# Patient Record
Sex: Female | Born: 1965 | Race: White | Hispanic: No | Marital: Single | Smoking: Never smoker
Health system: Southern US, Community
[De-identification: ages and names within clinical notes are randomized; demographics above are authoritative.]

## PROBLEM LIST (undated history)

## (undated) DIAGNOSIS — Z91018 Allergy to other foods: Secondary | ICD-10-CM

## (undated) DIAGNOSIS — E079 Disorder of thyroid, unspecified: Secondary | ICD-10-CM

## (undated) HISTORY — PX: ADENOIDECTOMY: SUR15

## (undated) HISTORY — PX: TONSILLECTOMY: SUR1361

---

## 2011-03-13 DIAGNOSIS — E039 Hypothyroidism, unspecified: Secondary | ICD-10-CM | POA: Insufficient documentation

## 2012-07-21 ENCOUNTER — Emergency Department
Admission: EM | Admit: 2012-07-21 | Discharge: 2012-07-21 | Disposition: A | Discharge status: Home / Self Care | Payer: BC Managed Care – PPO | Source: Home / Self Care | Attending: Family Medicine | Admitting: Family Medicine

## 2012-07-21 ENCOUNTER — Encounter: Payer: Self-pay | Admitting: *Deleted

## 2012-07-21 DIAGNOSIS — J069 Acute upper respiratory infection, unspecified: Secondary | ICD-10-CM

## 2012-07-21 DIAGNOSIS — M940 Chondrocostal junction syndrome [Tietze]: Secondary | ICD-10-CM

## 2012-07-21 HISTORY — DX: Disorder of thyroid, unspecified: E07.9

## 2012-07-21 MED ORDER — AMOXICILLIN-POT CLAVULANATE 875-125 MG PO TABS: 1.0000 | ORAL_TABLET | Freq: Two times a day (BID) | ORAL | Status: DC

## 2012-07-21 MED ORDER — BENZONATATE 200 MG PO CAPS: 200.0000 mg | ORAL_CAPSULE | Freq: Every day | ORAL | Status: DC

## 2012-07-21 NOTE — ED Notes (Signed)
Pt c/o cough, fatigue, body aches, and nasal congestion x 9 days. She reports having diarrhea the first day which resolved after 1 day. She has taken benadryl, Robtussin And ASA. She also has taken Augmentin x 7 days for her recurrent tonsillar issues.

## 2012-07-21 NOTE — ED Provider Notes (Signed)
History     CSN: [id number]  Arrival date & time 07/21/12  1024   First MD Initiated Contact with Patient 07/21/12 1048      Chief Complaint  Patient presents with  . Cough  . Fatigue       HPI Comments: Patient complains of approximately 8 day history of gradually progressive URI symptoms beginning with a mild sore throat (now improved), followed by nasal congestion and a cough.  Complains of fatigue and initial myalgias.  Cough is now worse at night and generally non-productive during the day.  There has been no pleuritic pain, shortness of breath, or wheezes.  However, she has now developed tightness over her anterior chest and sternum.  She has a history of adenoid infections, and her PCP started her on Augmentin, which she finishes today.  The history is provided by the patient.    Past Medical History  Diagnosis Date  . Thyroid disease     Past Surgical History  Procedure Laterality Date  . Tonsillectomy    . Adenoidectomy      Family History  Problem Relation Age of Onset  . Parkinson's disease Father     History  Substance Use Topics  . Smoking status: Never Smoker   . Smokeless tobacco: Not on file  . Alcohol Use: No    OB History   Grav Para Term Preterm Abortions TAB SAB Ect Mult Living                  Review of Systems + sore throat + cough No pleuritic pain, but has tightness in anterior chest No wheezing + nasal congestion + post-nasal drainage No sinus pain/pressure No itchy/red eyes No earache No hemoptysis No SOB No fever, + chills No nausea No vomiting No abdominal pain No diarrhea No urinary symptoms No skin rashes + fatigue + myalgias + headache Used OTC meds without relief  Allergies  Bactrim and Shellfish allergy  Home Medications   Current Outpatient Rx  Name  Route  Sig  Dispense  Refill  . levothyroxine (SYNTHROID, LEVOTHROID) 25 MCG tablet   Oral   Take 25 mcg by mouth daily.         Marland Kitchen  amoxicillin-clavulanate (AUGMENTIN) 875-125 MG per tablet   Oral   Take 1 tablet by mouth 2 (two) times daily. Take with food   10 tablet   0   . benzonatate (TESSALON) 200 MG capsule   Oral   Take 1 capsule (200 mg total) by mouth at bedtime.   12 capsule   0     BP 97/67  Pulse 80  Temp(Src) 98.5 F (36.9 C) (Oral)  Ht 5\' 7"  (1.702 m)  Wt 135 lb (61.236 kg)  BMI 21.14 kg/m2  SpO2 98%  Physical Exam Nursing notes and Vital Signs reviewed. Appearance:  Patient appears healthy, stated age, and in no acute distress Eyes:  Pupils are equal, round, and reactive to light and accomodation.  Extraocular movement is intact.  Conjunctivae are not inflamed  Ears:  Canals normal.  Tympanic membranes normal.  Nose:  Mildly congested turbinates.  No sinus tenderness.    Pharynx:  Normal Neck:  Supple.  Tender shotty posterior nodes are palpated bilaterally  Lungs:  Clear to auscultation.  Breath sounds are equal.  Chest:  Distinct tenderness to palpation over the mid-sternum.  Heart:  Regular rate and rhythm without murmurs, rubs, or gallops.  Abdomen:  Nontender without masses or hepatosplenomegaly.  Bowel sounds  are present.  No CVA or flank tenderness.  Extremities:  No edema.  No calf tenderness Skin:  No rash present.   ED Course  Procedures        1. Acute upper respiratory infections of unspecified site   2. Costochondritis       MDM  Prescription written for Benzonatate (Tessalon) to take at bedtime for night-time cough.  Take Mucinex D (guaifenesin with decongestant) twice daily for congestion.  Increase fluid intake, rest. May use Afrin nasal spray (or generic oxymetazoline) twice daily for about 5 days.  Also recommend using saline nasal spray several times daily and saline nasal irrigation (AYR is a common brand) Stop all antihistamines for now, and other non-prescription cough/cold preparations. May take Ibuprofen 200mg , 4 tabs every 8 hours with food for  chest/sternum discomfort. Continue Augmentin for 5 more days. Follow-up with family doctor if not improving 7 to 10 days.         Lattie Haw, MD 07/21/12 1137

## 2012-07-24 ENCOUNTER — Telehealth: Payer: Self-pay | Admitting: Family Medicine

## 2016-04-15 DIAGNOSIS — D500 Iron deficiency anemia secondary to blood loss (chronic): Secondary | ICD-10-CM | POA: Insufficient documentation

## 2016-04-18 DIAGNOSIS — E559 Vitamin D deficiency, unspecified: Secondary | ICD-10-CM | POA: Insufficient documentation

## 2017-04-06 DIAGNOSIS — N920 Excessive and frequent menstruation with regular cycle: Secondary | ICD-10-CM | POA: Insufficient documentation

## 2017-04-07 DIAGNOSIS — R790 Abnormal level of blood mineral: Secondary | ICD-10-CM | POA: Insufficient documentation

## 2018-04-30 DIAGNOSIS — C787 Secondary malignant neoplasm of liver and intrahepatic bile duct: Secondary | ICD-10-CM | POA: Insufficient documentation

## 2018-04-30 DIAGNOSIS — C189 Malignant neoplasm of colon, unspecified: Secondary | ICD-10-CM | POA: Insufficient documentation

## 2018-07-27 DIAGNOSIS — E876 Hypokalemia: Secondary | ICD-10-CM | POA: Insufficient documentation

## 2019-01-04 DIAGNOSIS — Z91018 Allergy to other foods: Secondary | ICD-10-CM | POA: Insufficient documentation

## 2019-09-07 DIAGNOSIS — G909 Disorder of the autonomic nervous system, unspecified: Secondary | ICD-10-CM | POA: Insufficient documentation

## 2019-09-07 DIAGNOSIS — Z85038 Personal history of other malignant neoplasm of large intestine: Secondary | ICD-10-CM | POA: Insufficient documentation

## 2019-09-07 DIAGNOSIS — Z87892 Personal history of anaphylaxis: Secondary | ICD-10-CM | POA: Insufficient documentation

## 2020-03-06 ENCOUNTER — Ambulatory Visit (INDEPENDENT_AMBULATORY_CARE_PROVIDER_SITE_OTHER): Payer: BC Managed Care – PPO

## 2020-03-06 ENCOUNTER — Encounter: Payer: Self-pay | Admitting: Family Medicine

## 2020-03-06 ENCOUNTER — Other Ambulatory Visit: Payer: Self-pay

## 2020-03-06 ENCOUNTER — Ambulatory Visit (INDEPENDENT_AMBULATORY_CARE_PROVIDER_SITE_OTHER): Payer: BC Managed Care – PPO | Admitting: Family Medicine

## 2020-03-06 VITALS — BP 102/82 | HR 73 | Ht 67.0 in | Wt 137.0 lb

## 2020-03-06 DIAGNOSIS — M999 Biomechanical lesion, unspecified: Secondary | ICD-10-CM | POA: Diagnosis not present

## 2020-03-06 DIAGNOSIS — M545 Low back pain, unspecified: Secondary | ICD-10-CM | POA: Diagnosis not present

## 2020-03-06 DIAGNOSIS — J986 Disorders of diaphragm: Secondary | ICD-10-CM | POA: Insufficient documentation

## 2020-03-06 DIAGNOSIS — M542 Cervicalgia: Secondary | ICD-10-CM | POA: Diagnosis not present

## 2020-03-06 DIAGNOSIS — M546 Pain in thoracic spine: Secondary | ICD-10-CM

## 2020-03-06 DIAGNOSIS — G8929 Other chronic pain: Secondary | ICD-10-CM

## 2020-03-06 DIAGNOSIS — M549 Dorsalgia, unspecified: Secondary | ICD-10-CM | POA: Insufficient documentation

## 2020-03-06 MED ORDER — QVAR REDIHALER 40 MCG/ACT IN AERB: 1.0000 | INHALATION_SPRAY | Freq: Two times a day (BID) | RESPIRATORY_TRACT | 0 refills | Status: DC

## 2020-03-06 MED ORDER — ALBUTEROL SULFATE HFA 108 (90 BASE) MCG/ACT IN AERS: 2.0000 | INHALATION_SPRAY | RESPIRATORY_TRACT | 6 refills | Status: DC | PRN

## 2020-03-06 NOTE — Assessment & Plan Note (Signed)
   Decision today to treat with OMT was based on Physical Exam  After verbal consent patient was treated with HVLA, ME, FPR techniques in cervical, thoracic, rib,  areas,  Patient tolerated the procedure well with improvement in symptoms  Patient given exercises, stretches and lifestyle modifications  See medications in patient instructions if given  Patient will follow up in 4-8 weeks 

## 2020-03-06 NOTE — Patient Instructions (Signed)
Ventolin try when you have the spasm Quvar daily for 2 weeks if other physicians say its ok Scapular ex Xrays today Consider hemp protein See me again in 4-6 weeks

## 2020-03-06 NOTE — Assessment & Plan Note (Signed)
Patient has diffuse back pain. Lower aorta, thoracic as well as cervical.  Patient did respond fairly well to osteopathic manipulation today.  Patient does have a lot of anxiety with patient's health issues.  Has significant difficulty with multiple different medications and vitamins which makes it difficult to treat as well.  Patient describes something is more of a spasm and wondering if there is some mild diaphragmatic irritation secondary to the surgery as well as her not completely addressing pain with scapular dyskinesis and weakness.  Patient is also having difficulty with nutrition medicine could be contributing as well.Bianca [name]

## 2020-03-06 NOTE — Assessment & Plan Note (Addendum)
Questionable neck irritation.  Discussed case and Ventolin as potential causes and see if this would be helpful.  If no improvement patient is to discontinue.  Patient with the inhaled steroid wants to check with her other physicians before she starts it which I said would be fine as well.  Would only want her to use it for 2 weeks to see if it would be beneficial.  I am not completely optimistic this is all her problems and I do think that there is likely some psychosomatic aspect to it as well.  We will help where we can can.

## 2020-03-06 NOTE — Progress Notes (Signed)
[facility] [place] Phone: [phone] Subjective:   [name], am serving as a scribe for Dr. Hulan Saas. This visit occurred during the SARS-CoV-2 public health emergency.  Safety protocols were in place, including screening questions prior to the visit, additional usage of staff PPE, and extensive cleaning of exam room while observing appropriate contact time as indicated for disinfecting solutions.   I'm seeing this patient by the request  of:  Teodoro Kil, PA-C  CC: Low back pain  [id number]  Letasha Kershaw is a 54 y.o. female coming in with complaint of neck and back pain. Patient had liver surgery last year for cancer. Patient unable to stand straight due to pain in lower back. Patient has been doing physical therapy.  Patient has had multiple scans and I cannot find what is kind of going on at this time.  Likely there has been no recurrence of the liver cancer status post lobectomy   Also having pain in ribs and cervical spine. Has tried muscle relaxors in hospital but did not take any outside of hospital due to a dairy allergy.   Patient feels like she has chest cramps where her muscles spasm. Patient has neuropathy and hypertension so unable to stand for long periods of time.  Patient states that the muscle cramps seem to be under her rib cage.  Happens in some so she can sometimes get on the brake when she tries to straighten up.  Has significant difficulty.  Sees acupuncturist in [place]. This treatment has been helping.  Patient has difficulty with food still with her gastrointestinal.  Patient states that he is unable to get the protein that she is supposed to get secondary to this.  He thinks that could be contributing as well.     Patient is seeing pmr has multiple visits already scheduled seems to be getting acupuncture.  Patient has also undergone a significant laboratory work-up recently  that was fairly unremarkable.  Recent MRI of the abdomen and pelvis was reviewed showed the post ablative changes from the surgical intervention but otherwise fairly unremarkable.  Past Medical History:  Diagnosis Date  . Thyroid disease    Past Surgical History:  Procedure Laterality Date  . ADENOIDECTOMY    . TONSILLECTOMY     Social History   Socioeconomic History  . Marital status: Single    Spouse name: Not on file  . Number of children: Not on file  . Years of education: Not on file  . Highest education level: Not on file  Occupational History  . Not on file  Tobacco Use  . Smoking status: Never Smoker  Substance and Sexual Activity  . Alcohol use: No  . Drug use: No  . Sexual activity: Not on file  Other Topics Concern  . Not on file  Social History Narrative  . Not on file   Social Determinants of Health   Financial Resource Strain:   . Difficulty of Paying Living Expenses: Not on file  Food Insecurity:   . Worried About Charity fundraiser in the Last Year: Not on file  . Ran Out of Food in the Last Year: Not on file  Transportation Needs:   . Lack of Transportation (Medical): Not on file  . Lack of Transportation (Non-Medical): Not on file  Physical Activity:   . Days of Exercise per Week: Not on file  . Minutes of Exercise per Session: Not on  file  Stress:   . Feeling of Stress : Not on file  Social Connections:   . Frequency of Communication with Friends and Family: Not on file  . Frequency of Social Gatherings with Friends and Family: Not on file  . Attends Religious Services: Not on file  . Active Member of Clubs or Organizations: Not on file  . Attends Archivist Meetings: Not on file  . Marital Status: Not on file   Allergies  Allergen Reactions  . Bactrim [Sulfamethoxazole-Trimethoprim]   . Shellfish Allergy    Family History  Problem Relation Age of Onset  . Parkinson's disease Father     Current Outpatient Medications  (Endocrine & Metabolic):  .  levothyroxine (SYNTHROID, LEVOTHROID) 25 MCG tablet, Take 25 mcg by mouth daily.   Current Outpatient Medications (Respiratory):  .  benzonatate (TESSALON) 200 MG capsule, Take 1 capsule (200 mg total) by mouth at bedtime. Marland Kitchen  albuterol (VENTOLIN HFA) 108 (90 Base) MCG/ACT inhaler, Inhale 2 puffs into the lungs every 4 (four) hours as needed for wheezing or shortness of breath. .  beclomethasone (QVAR REDIHALER) 40 MCG/ACT inhaler, Inhale 1 puff into the lungs 2 (two) times daily.    Current Outpatient Medications (Other):  .  amoxicillin-clavulanate (AUGMENTIN) 875-125 MG per tablet, Take 1 tablet by mouth 2 (two) times daily. Take with food   Reviewed prior external information including notes and imaging from  primary care provider As well as notes that were available from care everywhere and other healthcare systems.  Past medical history, social, surgical and family history all reviewed in electronic medical record.  No pertanent information unless stated regarding to the chief complaint.   Review of Systems:  No headache, visual changes, nausea, vomiting,  constipation, dizziness, skin rash, fevers, chills, night sweats, weight loss, swollen lymph nodes, body aches, joint swelling, chest pain, shortness of breath, mood changes. POSITIVE muscle aches, body aches, abdominal pain and diarrhea  Objective  Blood pressure 102/82, pulse 73, height 5\' 7"  (1.702 m), weight 137 lb (62.1 kg), SpO2 99 %.   General: No apparent distress alert and oriented x3 mood and affect normal, dressed appropriately.  HEENT: Pupils equal, extraocular movements intact  Respiratory: Patient's speak in full sentences and does not appear short of breath patient though does have difficulty taking deep breaths. Cardiovascular: No lower extremity edema, non tender, no erythema  Gait normal with good balance and coordination.  MSK:  Non tender with full range of motion and good  stability and symmetric strength and tone of shoulders, elbows, wrist, hip, knee and ankles bilaterally.  Upper back exam shows the patient does have some very mild kyphosis noted.  The patient does have some difficulty laying flat.  Patient's neck does have some mild tightness with side bending but negative Spurling's.  More of a tightness of the muscles.  Mild scapular dyskinesis noted right greater than left.  No spinous process tenderness noted. Low back exam also has some mild loss of lordosis.  Osteopathic findings   C4 flexed rotated and side bent left C6 flexed rotated and side bent left T3 extended rotated and side bent right inhaled third rib T7 extended rotated and side bent left T10 extended rotated and side bent right  [id number]; 15 additional minutes spent for Therapeutic exercises as stated in above notes.  This included exercises focusing on stretching, strengthening, with significant focus on eccentric aspects.   Long term goals include an improvement in range of motion, strength, endurance  as well as avoiding reinjury. Patient's frequency would include in 1-2 times a day, 3-5 times a week for a duration of 6-12 weeks. Exercises that included:  Basic scapular stabilization to include adduction and depression of scapula Scaption, focusing on proper movement and good control Internal and External rotation utilizing a theraband, with elbow tucked at side entire time Rows with theraband   Proper technique shown and discussed handout in great detail with ATC.  All questions were discussed and answered.     Impression and Recommendations:     The above documentation has been reviewed and is accurate and complete Lyndal Pulley, DO

## 2020-03-12 DIAGNOSIS — E538 Deficiency of other specified B group vitamins: Secondary | ICD-10-CM | POA: Insufficient documentation

## 2020-04-08 NOTE — Progress Notes (Signed)
[facility] [address] [place] Phone: [phone] Subjective:   I Bianca [name] am serving as a Education administrator for Dr. Hulan Saas.  This visit occurred during the SARS-CoV-2 public health emergency.  Safety protocols were in place, including screening questions prior to the visit, additional usage of staff PPE, and extensive cleaning of exam room while observing appropriate contact time as indicated for disinfecting solutions.   I'm seeing this patient by the request  of:  Teodoro Kil, PA-C  CC: Stomach and back pain follow-up  [id number]  Bianca [name] is a 54 y.o. female coming in with complaint of back, diaphragm and neck pain. OMT 03/06/2020. Patient states her pain is moderate. Went to PT yesterday. Knows that her pain will take time. After last adjustment she noticed improvements. States her pain feels like a solar plexus. States that her pain is sensitive to certain movements. Eating triggers the pain. Abdomen is a little swollen. Overall she believes she is making progress. Anything that causes pressure on the ribs/ abdomen causes pain. Calcium may be off.   Medications patient has been prescribed: Qvar, Ventolin feels like it made improvement initially but does not need it anymore.  Taken from oncology note  ONCOLOGIC HISTORY  1. 04/25/18 CT A/P Findings consistent with small bowel obstruction with dilated distal small bowel measuring up to 4.0 cm in diameter the point of obstruction appears to be at the ileocecal valve with there is suspicion of a soft tissue mass on image 81 of series There is a 2.3 x 2.7 cm lesion in the right lobe of the liver on image 29 of series 2 which does not have the appearance of a cyst or hemangioma although this could be a benign lesion metastatic disease cannot be excluded. 2 additional subcentimeter  lesions are noted in the right lobe of the liver. 2. 04/26/18 CEA 31.8, CA 19-9 156 3. 04/26/18  Ascites benign mesothelial cells and mixed inflammation. Negative for malignancy.  4. 04/26/18 Xray recommend advancement gastric tube 5. 04/26/18 CXR no acute cardiopulmonary disease. Recommend advance gastric tube 6. 04/26/18 R colon, ileum, appendix, R hemicolectomy. 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 4.2 cm in greatest dimension. Extends through muscularis propria into subserosal soft tissue but does not invade serosa. Suspicious for vascular invasion. Margins negative. 1/27 LNs. Appendix with residual tubular adenoma with HGD. [id number].  7. 04/26/18 Liver core needle biopsy metastatic adenocarcinoma morphologically c/w colon primary.  8. NRAS, KRAS mutations not detected. BRAF IHC stain positive. MSS by IHC (OSH testing). CDX-2 stain positive.  9. 04/30/18 CT chest small bilateral pleural effusions with bibasilar opacities likely atelectasis. No mets in chest. Liver lesion concerning for met. Indeterminate splenic lesion. 10. 06/29/18 Initiation of FOLFOX  11. [facility] with BRAF V600E (as well as other findings which we can insert here) 12. 07/13/18 Switch to FOLFIRINOX 13. 07/27/18 Removed irinotecan from treatment plan, now back on FOLFOX  14. 09/07/18 Continuing FOLFOX Cycle 6  15. 09/20/18 CT chest demonstrates RLL indeterminate subpleural nodular opacities favored to represent atelectasis. MRI abdomen demonstrates decrease in size of hepatic metastasis in segment 5/8 and two additional indeterminate subcentimeter nodules too small to characterize. 16. 11/30/18 Mild oxaliplatin reaction with hives 17. 12/28/18 CT chest NED 18. 12/28/18 MRI continued decrease in size of hepatic 5/8 mets, stable smaller foci in R hepatic lobe which are too small to characterize 19. 01/11/19 Liver segment 6 partial hepatectomy. Focal residual adenocarcinoma in background of tx  effect. All margins free of tumor.  20. 04/28/19 CEA 2.6 21. 04/28/19 CT chest Interval decrease in  size of previously seen right lower lobe soft tissue nodule suggesting an inflammatory or infectious process. There is however a new 2 mm left lower lobe nodule which is indeterminant. Continued surveillance is recommended. Ill-defined right hepatic lobe abnormality best characterized on recent MRI. 22. 04/28/19 MRI Presumed post-ablation changes in posterolateral segment 6. No definite residual enhancement is seen. Post surgical changes after segment 5/8 resection without surrounding enhancement to suggest residual disease. Unchanged smaller residual foci within the right hepatic lobe which are too small to characterize. 23. 07/14/19 CT chest stable lung nodules, no metastatic disease 24. 07/14/19 MRI abd/pelvis post surgical and ablation changes in the liver, no recurrent/metastatic disease 25. 07/14/19 CEA 2.8 26. 10/27/19 CT chest NED, stable tiny pulmonary nodules 27. 10/27/19 MRI abd/pelvis NED, stable liver ablation cavity 28. 02/07/20 MRI Abd/pelvis, CT Chest NED. CEA 1.7          Reviewed prior external information including notes and imaging from previsou exam, outside providers and external EMR if available.   As well as notes that were available from care everywhere and other healthcare systems.  Past medical history, social, surgical and family history all reviewed in electronic medical record.  No pertanent information unless stated regarding to the chief complaint.   Past Medical History:  Diagnosis Date  . Thyroid disease     Allergies  Allergen Reactions  . Bactrim [Sulfamethoxazole-Trimethoprim]   . Shellfish Allergy      Review of Systems:  No headache, visual changes, nausea, vomiting, diarrhea, constipation, dizziness,  skin rash, fevers, chills, night sweats, weight loss, swollen lymph nodes, body aches, joint swelling, chest pain, shortness of breath, mood changes. POSITIVE muscle aches, abdominal pain  Objective  Blood pressure 100/70, pulse 78, height _0   (1.702 m), weight 138 lb (62.6 kg), SpO2 97 %.   General: No apparent distress alert and oriented x3 mood and affect normal, dressed appropriately.  HEENT: Pupils equal, extraocular movements intact  Respiratory: Patient's speak in full sentences and does not appear short of breath  Cardiovascular: No lower extremity edema, non tender, no erythema  Abdominal wall still shows some fullness on the right side near the scar tissue that is consistent with potential seroma. No true mass appreciated. Neck exam shows the patient does have some loss of lordosis. Tightness noted in the parascapular region right greater than left. Patient's low does have improvement in range of motion overall.  Osteopathic findings   T3 extended rotated and side bent right inhaled rib T9 extended rotated and side bent left L2 flexed rotated and side bent right t       Assessment and Plan: Diaphragmatic disorder Patient does have what appears to be a potential seroma of the abdomen area. I do think that there is some diaphragmatic disorder or irritation secondary to patient surgical intervention is likely scar tissue. Patient will is making improvement at this time. Patient does feel that the B12 was helpful. Discussed with patient some balance continue to follow-up with her with her other doctors for this. I will help her with more of the back pain but she does need further evaluation with likely continued advanced imaging of some of the other aspects.     Nonallopathic problems  Decision today to treat with OMT was based on Physical Exam  After verbal consent patient was treated with HVLA, ME, FPR techniques in , rib, thoracic,  lumbar, and  areas  Patient tolerated the procedure well with improvement in symptoms  Patient given exercises, stretches and lifestyle modifications  See medications in patient instructions if given  Patient will follow up in 4-8 weeks      The above documentation has been  reviewed and is accurate and complete Lyndal Pulley, DO       Note: This dictation was prepared with Dragon dictation along with smaller phrase technology. Any transcriptional errors that result from this process are unintentional.

## 2020-04-09 ENCOUNTER — Encounter: Payer: Self-pay | Admitting: Family Medicine

## 2020-04-09 ENCOUNTER — Other Ambulatory Visit: Payer: Self-pay

## 2020-04-09 ENCOUNTER — Ambulatory Visit (INDEPENDENT_AMBULATORY_CARE_PROVIDER_SITE_OTHER): Payer: BC Managed Care – PPO | Admitting: Family Medicine

## 2020-04-09 VITALS — BP 100/70 | HR 78 | Ht 67.0 in | Wt 138.0 lb

## 2020-04-09 DIAGNOSIS — J986 Disorders of diaphragm: Secondary | ICD-10-CM | POA: Diagnosis not present

## 2020-04-09 DIAGNOSIS — M999 Biomechanical lesion, unspecified: Secondary | ICD-10-CM | POA: Diagnosis not present

## 2020-04-09 MED ORDER — CYANOCOBALAMIN 1000 MCG/ML IJ SOLN: 1000.0000 ug | Freq: Once | INTRAMUSCULAR | 0 refills | Status: DC

## 2020-04-09 NOTE — Assessment & Plan Note (Addendum)
Patient does have what appears to be a potential seroma of the abdomen area. I do think that there is some diaphragmatic disorder or irritation secondary to patient surgical intervention is likely scar tissue. Patient will is making improvement at this time. Patient does feel that the B12 was helpful. Discussed with patient some balance continue to follow-up with her with her other doctors for this. I will help her with more of the back pain but she does need further evaluation with likely continued advanced imaging of some of the other aspects.

## 2020-04-09 NOTE — Patient Instructions (Addendum)
Good to see you B12 injections sent in Do every other week for 4 weeks then monthly after Glutathione 500 mg daily for gut health   Otherwise continue exercises See me again in 4-5 weeks

## 2020-04-10 ENCOUNTER — Encounter: Payer: Self-pay | Admitting: Family Medicine

## 2020-05-08 NOTE — Progress Notes (Signed)
[place] Phone: [phone] Subjective:   Bianca [name], am serving as a scribe for Dr. Hulan Saas. This visit occurred during the SARS-CoV-2 public health emergency.  Safety protocols were in place, including screening questions prior to the visit, additional usage of staff PPE, and extensive cleaning of exam room while observing appropriate contact time as indicated for disinfecting solutions.   I'm seeing this patient by the request  of:  Teodoro Kil, PA-C  CC: Back pain follow-up  PPI:[id number]  Bianca [name] is a 54 y.o. female coming in with complaint of back and neck pain. OM T11/23/2021. Patient states that she did have some improvement but then got worse. Patient was not seeing physical therapy and feels this caused an increase in tightness. Is now back in therapy and feels improvement but is not 100%. Pain is in distal sternum and wraps around into the ribs on both sides. Has been using inhaler but mentions they do cause anxiety.  Has been working with a physical therapy and now doing more of extension does not seem to be getting more improvement in the range of motion again.  Patient has been able to put up Christmas lights which is the first time in 2 years which patient is very happy with.    Medications patient has been prescribed:  Qvar, Ventolin Taking:         Reviewed prior external information including notes and imaging from previsou exam, outside providers and external EMR if available.   As well as notes that were available from care everywhere and other healthcare systems.  Past medical history, social, surgical and family history all reviewed in electronic medical record.  [name] information unless stated regarding to the chief complaint.   Past Medical History:  Diagnosis Date  . Thyroid disease     Allergies  Allergen Reactions  . Bactrim  [Sulfamethoxazole-Trimethoprim]   . Shellfish Allergy      Review of Systems:  [name] headache, visual changes, nausea, vomiting, diarrhea, constipation, dizziness, abdominal pain, skin rash, fevers, chills, night sweats, weight loss, swollen lymph nodes,, joint swelling, chest pain, shortness of breath, mood changes. POSITIVE muscle aches, body aches  Objective  There were [name] vitals taken for this visit.   General: [name] apparent distress alert and oriented x3 mood and affect normal, dressed appropriately.  Patient is still very anxious HEENT: Pupils equal, extraocular movements intact  Respiratory: Patient's speak in full sentences and does not appear short of breath  Cardiovascular: [name] lower extremity edema, non tender, [name] erythema  Neuro: Cranial nerves II through XII are intact, neurovascularly intact in all extremities with 2+ DTRs and 2+ pulses.  Gait normal with good balance and coordination.  Back exam still shows some mild tightness noted in the parascapular region right greater than left.  Seems to be more in the also of the thoracolumbar juncture right greater than left.  Osteopathic findings  T3 extended rotated and side bent right  T11 extended rotated and side bent right L2 flexed rotated and side bent right Sacrum right on right   Patient's x-rays were once again reviewed from October 2021 that shows [name] significant bony abnormality of the thoracic or lumbar spine    Assessment and Plan: Back pain Patient back pain seems to be improving.  I do think that patient does have tightness and potential some mild adhesions noted around the esophagus and diaphragm that does contribute to patient  having more discomfort.  It seems that we are making significant progress at this time and patient is happy.  Patient did note the Zanaflex because patient is having trouble.  Patient will check with her other doctor to make sure that this is safe but I do feel that a 2 mg and at night only is  very reasonable when patient is having a severe flare.  Patient will continue to stay active.  Continue with the extension exercises and follow-up with me again 6 to 8 weeks     Nonallopathic problems  Decision today to treat with OMT was based on Physical Exam  After verbal consent patient was treated with HVLA, ME, FPR techniques in   thoracic, lumbar, and sacral  areas  Patient tolerated the procedure well with improvement in symptoms  Patient given exercises, stretches and lifestyle modifications  See medications in patient instructions if given  Patient will follow up in 4-8 weeks      The above documentation has been reviewed and is accurate and complete Bianca [name]       Note: This dictation was prepared with Dragon dictation along with smaller phrase technology. Any transcriptional errors that result from this process are unintentional.

## 2020-05-09 ENCOUNTER — Other Ambulatory Visit: Payer: Self-pay

## 2020-05-09 ENCOUNTER — Ambulatory Visit (INDEPENDENT_AMBULATORY_CARE_PROVIDER_SITE_OTHER): Payer: BC Managed Care – PPO | Admitting: Family Medicine

## 2020-05-09 ENCOUNTER — Encounter: Payer: Self-pay | Admitting: Family Medicine

## 2020-05-09 VITALS — BP 102/62 | HR 69 | Ht 67.0 in | Wt 140.0 lb

## 2020-05-09 DIAGNOSIS — C189 Malignant neoplasm of colon, unspecified: Secondary | ICD-10-CM

## 2020-05-09 DIAGNOSIS — C787 Secondary malignant neoplasm of liver and intrahepatic bile duct: Secondary | ICD-10-CM

## 2020-05-09 DIAGNOSIS — G8929 Other chronic pain: Secondary | ICD-10-CM

## 2020-05-09 DIAGNOSIS — J986 Disorders of diaphragm: Secondary | ICD-10-CM

## 2020-05-09 DIAGNOSIS — M545 Low back pain, unspecified: Secondary | ICD-10-CM | POA: Diagnosis not present

## 2020-05-09 DIAGNOSIS — M999 Biomechanical lesion, unspecified: Secondary | ICD-10-CM

## 2020-05-09 DIAGNOSIS — G909 Disorder of the autonomic nervous system, unspecified: Secondary | ICD-10-CM

## 2020-05-09 NOTE — Patient Instructions (Signed)
Zanaflex 2mg  at night only as needed Check with other docs Keep doing everything I think you are making progress See me in 7-8 weeks

## 2020-05-09 NOTE — Assessment & Plan Note (Signed)
Patient back pain seems to be improving.  I do think that patient does have tightness and potential some mild adhesions noted around the esophagus and diaphragm that does contribute to patient having more discomfort.  It seems that we are making significant progress at this time and patient is happy.  Patient did note the Zanaflex because patient is having trouble.  Patient will check with her other doctor to make sure that this is safe but I do feel that a 2 mg and at night only is very reasonable when patient is having a severe flare.  Patient will continue to stay active.  Continue with the extension exercises and follow-up with me again 6 to 8 weeks

## 2020-05-14 ENCOUNTER — Encounter: Payer: Self-pay | Admitting: Family Medicine

## 2020-05-14 ENCOUNTER — Other Ambulatory Visit: Payer: Self-pay

## 2020-05-14 MED ORDER — TIZANIDINE HCL 2 MG PO TABS: 2.0000 mg | ORAL_TABLET | Freq: Every day | ORAL | 0 refills | Status: DC

## 2020-06-19 NOTE — Progress Notes (Signed)
[facility] [address] [place] Phone: [phone] Subjective:   I Bianca [name] am serving as a Education administrator for Dr. Hulan Saas.  This visit occurred during the SARS-CoV-2 public health emergency.  Safety protocols were in place, including screening questions prior to the visit, additional usage of staff PPE, and extensive cleaning of exam room while observing appropriate contact time as indicated for disinfecting solutions.   I'm seeing this patient by the request  of:  Teodoro Kil, PA-C  CC: Back pain follow-up  POE:[id number]  Bianca [name] is a 55 y.o. female coming in with complaint of back and neck pain. OMT 05/09/2020. Patient states she sent mychart message about updates. States she has pain in her sternum and upper back between her shoulder blades. Sates her sternum is sore to the touch. States that it popped really loud and she could stand. States the pain caused pain with breathing. Issue started to decrease. States the episodes come and go. When she has the episodes she can't burp until she was back into alignment. States the sternum pain is tight and causes pressure. Eating sometimes triggers the pain. Pain with twisting to the left. Sometimes getting in and out of car. Anything that causes pressure in the chest triggers the pain.  Patient states overall she does feel like she has made some improvement.  Patient continues to work with physical therapy and feels like he is making progress there as well.  Medications patient has been prescribed: Zanaflex Qvar  Taking: No  Reviewed patient's charts from outside facility.  Has followed up with cardiology fairly recently patient has been seen for autonomic dysfunction and has been doing really well no significant changes in management and is to follow-up with them again in 3 to 4 months.    Patient states also interestingly after last adjustment patient had improvement with her  port for her blood draws.    Reviewed prior external information including notes and imaging from previsou exam, outside providers and external EMR if available.   As well as notes that were available from care everywhere and other healthcare systems.  Past medical history, social, surgical and family history all reviewed in electronic medical record.  No pertanent information unless stated regarding to the chief complaint.   Past surgical history includes bowel resection in 2019 patient has now been stable since 2020 with imaging it appears.  Patient is to continue laboratory work-up every 3 months as well as a CT chest and MRI abdominal and pelvis.  Patient did have a recommendation to have a colonoscopy and endoscopy in the near future when reviewing patient's last note in December 2021 by oncology  Past Medical History:  Diagnosis Date  . Thyroid disease     Allergies  Allergen Reactions  . Atropine Sulfate Other (See Comments)    Low BP; numbness in both arms   . Beef (Bovine) Protein Anaphylaxis  . Justicia Adhatoda (Malabar Nut Tree) [Justicia Adhatoda] Hives  . Lac Bovis Anaphylaxis  . Sulfa Antibiotics Anaphylaxis  . Bactrim [Sulfamethoxazole-Trimethoprim]   . Shellfish Allergy      Review of Systems:  No headache, visual changes, nausea, vomiting, diarrhea, constipation, dizziness, skin rash, fevers, chills, night sweats, weight loss, swollen lymph nodes, joint swelling, chest pain, shortness of breath, mood changes. POSITIVE muscle aches, mild body aches, abdominal pain  Objective  Blood pressure 110/70, pulse 79, height 5\' 7"  (1.702 m), weight 143 lb (64.9 kg), SpO2 100 %.  General: No apparent distress alert and oriented x3 mood and affect normal, dressed appropriately.  HEENT: Pupils equal, extraocular movements intact  Respiratory: Patient's speak in full sentences and does not appear short of breath  Gait normal with good balance and coordination.  MSK:  Non  tender with full range of motion and good stability and symmetric strength and tone of shoulders, elbows, wrist, hip, knee and ankles bilaterally.  Back -tightness noted in the parascapular region bilaterally.  Osteopathic findings  T3 extended rotated and side bent right inhaled rib T10 extended rotated and side bent left L2 flexed rotated and side bent right Sacrum right on right       Assessment and Plan:   Back pain Back pain is multifactorial.  Patient likely had been making some improvement.  We discussed with patient that I do feel that a lot of patient's discomfort and still is viscerosomatic as well.  With patient having some upper GI symptoms with the burping I am concerned that patient does have a potential esophageal stricture that could be contributing and encouraged her to follow-up with gastroenterology and go through with the endoscopy.  I think that this could potentially help with some of the different occurrences that patient has from time to time.  I do believe that patient with the exercises have been able to improve her confidence and is helping her have control over some of her symptoms.  Encouraged her to continue to be active.  Patient has not tried good muscle relaxer and is concerned secondary to her multiple allergies.  We discussed with her that I think she can stay off of this medication with her making improvement overall.  Encouraged her to continue close follow-ups with all her other physicians secondary to the high acuity of some of her problems.  Can follow-up with me again 6 to 8 weeks.  Total time with patient today and reviewing the chart 40 minutes   Nonallopathic problems  Decision today to treat with OMT was based on Physical Exam  After verbal consent patient was treated with HVLA, ME, FPR techniques in thoracic, lumbar, and sacral  areas  Patient tolerated the procedure well with improvement in symptoms  Patient given exercises, stretches and  lifestyle modifications  See medications in patient instructions if given  Patient will follow up in 4-8 weeks      The above documentation has been reviewed and is accurate and complete Lyndal Pulley, DO       Note: This dictation was prepared with Dragon dictation along with smaller phrase technology. Any transcriptional errors that result from this process are unintentional.

## 2020-06-20 ENCOUNTER — Encounter: Payer: Self-pay | Admitting: Family Medicine

## 2020-06-20 ENCOUNTER — Other Ambulatory Visit: Payer: Self-pay

## 2020-06-20 ENCOUNTER — Ambulatory Visit (INDEPENDENT_AMBULATORY_CARE_PROVIDER_SITE_OTHER): Payer: Medicare Other | Admitting: Family Medicine

## 2020-06-20 VITALS — BP 110/70 | HR 79 | Ht 67.0 in | Wt 143.0 lb

## 2020-06-20 DIAGNOSIS — M545 Low back pain, unspecified: Secondary | ICD-10-CM

## 2020-06-20 DIAGNOSIS — G8929 Other chronic pain: Secondary | ICD-10-CM

## 2020-06-20 DIAGNOSIS — M999 Biomechanical lesion, unspecified: Secondary | ICD-10-CM | POA: Diagnosis not present

## 2020-06-20 NOTE — Patient Instructions (Addendum)
Good to see you Follow up with GI to get endoscopy done.  Tried the manipulation Glad we are making progress I do think the stomach is playing a roll See me again in 6-8 weeks

## 2020-06-21 ENCOUNTER — Encounter: Payer: Self-pay | Admitting: Family Medicine

## 2020-06-21 NOTE — Assessment & Plan Note (Addendum)
Back pain is multifactorial.  Patient likely had been making some improvement.  We discussed with patient that I do feel that a lot of patient's discomfort and still is viscerosomatic as well.  With patient having some upper GI symptoms with the burping I am concerned that patient does have a potential esophageal stricture that could be contributing and encouraged her to follow-up with gastroenterology and go through with the endoscopy.  I think that this could potentially help with some of the different occurrences that patient has from time to time.  I do believe that patient with the exercises have been able to improve her confidence and is helping her have control over some of her symptoms.  Encouraged her to continue to be active.  Patient has not tried good muscle relaxer and is concerned secondary to her multiple allergies.  We discussed with her that I think she can stay off of this medication with her making improvement overall.  Encouraged her to continue close follow-ups with all her other physicians secondary to the high acuity of some of her problems.  Can follow-up with me again 6 to 8 weeks.  Total time with patient today and reviewing the chart 40 minutes

## 2020-07-24 NOTE — Progress Notes (Signed)
[facility] [address] [place] Phone: [phone] Subjective:   I [name] am serving as a Education administrator for Dr. Hulan Saas.  This visit occurred during the SARS-CoV-2 public health emergency.  Safety protocols were in place, including screening questions prior to the visit, additional usage of staff PPE, and extensive cleaning of exam room while observing appropriate contact time as indicated for disinfecting solutions.   I'm seeing this patient by the request  of:  Teodoro Kil, PA-C  CC: Upper back, chest and abdominal discomfort follow-up  BSW:[id number]  Dona Walby is a 55 y.o. female coming in with complaint of back and neck pain Patient states things have changed a lot since last visit. States she was doing well. Watching how she bends. Diaphragm issue has improved. States she may have overdone some stretching and her sternum has popping a lot. Mostly with overhead activity. After yard work she states the sternum area was sore. Laying flat with putting her hands behind her head relieves pain. UTI last week.  Patient is on antibiotics and it has not made her stomach any worse.  Patient though is unable to take certain things such as the multivitamin secondary to her stomach.  Patient states that with certain activities starts having more popping in the sternal area.  Patient has not noticed any swelling, hurts for a day after some activity and then seems to get better.  Patient still states that she is better than what she has started off but maybe not quite as good as she was last time.  Medications patient has been prescribed: zanaflex   Taking: Intermittently     Previous visit 2/3- Back pain is multifactorial.  Patient likely had been making some improvement.  We discussed with patient that I do feel that a lot of patient's discomfort and still is viscerosomatic as well.  With patient having some upper GI symptoms with the  burping I am concerned that patient does have a potential esophageal stricture that could be contributing and encouraged her to follow-up with gastroenterology and go through with the endoscopy.  I think that this could potentially help with some of the different occurrences that patient has from time to time.  I do believe that patient with the exercises have been able to improve her confidence and is helping her have control over some of her symptoms.  Encouraged her to continue to be active.  Patient has not tried good muscle relaxer and is concerned secondary to her multiple allergies.  We discussed with her that I think she can stay off of this medication with her making improvement overall.  Encouraged her to continue close follow-ups with all her other physicians secondary to the high acuity     Reviewed prior external information including notes and imaging from previsou exam, outside providers and external EMR if available.   As well as notes that were available from care everywhere and other healthcare systems.  Past medical history, social, surgical and family history all reviewed in electronic medical record.  No pertanent information unless stated regarding to the chief complaint.   Past Medical History:  Diagnosis Date  . Thyroid disease     Allergies  Allergen Reactions  . Atropine Sulfate Other (See Comments)    Low BP; numbness in both arms   . Beef (Bovine) Protein Anaphylaxis  . Justicia Adhatoda (Malabar Nut Tree) [Justicia Adhatoda] Hives  . Lac Bovis Anaphylaxis  . Sulfa Antibiotics Anaphylaxis  .  Bactrim [Sulfamethoxazole-Trimethoprim]   . Shellfish Allergy      Review of Systems:  No headache, visual changes, , vomiting, diarrhea, constipation, dizziness, , skin rash, fevers, chills, night sweats, weight loss, swollen lymph nodes, , joint swelling, chest pain, shortness of breath, mood changes. POSITIVE muscle aches, body aches, abdominal pain, nausea  Objective   Blood pressure 100/80, pulse 79, height 5\' 7"  (1.702 m), weight 150 lb (68 kg), SpO2 99 %.   General: No apparent distress alert and oriented x3 mood and affect normal, dressed appropriately.  HEENT: Pupils equal, extraocular movements intact  Respiratory: Patient's speak in full sentences and does not appear short of breath  Cardiovascular: No lower extremity edema, non tender, no erythema   Gait normal with good balance and coordination.  MSK:  Non tender with full range of motion and good stability and symmetric strength and tone of shoulders, elbows, wrist, hip, knee and ankles bilaterally.  Back -patient does have tightness noted in the parascapular region right greater than left but it is bilateral.  Patient's neck exam shows good range of motion.  On palpation over the sternum no significant pain whatsoever noted.  No significant abnormality noted.  More pain in the epigastric region.  No involuntary guarding.  No rebound tenderness.   Osteopathic findings  T3 extended rotated and side bent left inhaled rib T9 extended rotated and side bent left L2 flexed rotated and side bent right Sacrum right on right       Assessment and Plan:  No problem-specific Assessment & Plan notes found for this encounter.    Nonallopathic problems  Decision today to treat with OMT was based on Physical Exam  After verbal consent patient was treated with HVLA, ME, FPR techniques in  rib, thoracic, lumbar, and sacral  areas  Patient tolerated the procedure well with improvement in symptoms  Patient given exercises, stretches and lifestyle modifications  See medications in patient instructions if given  Patient will follow up in 4-8 weeks      The above documentation has been reviewed and is accurate and complete Lyndal Pulley, DO       Note: This dictation was prepared with Dragon dictation along with smaller phrase technology. Any transcriptional errors that result from this  process are unintentional.

## 2020-07-25 ENCOUNTER — Other Ambulatory Visit: Payer: Self-pay

## 2020-07-25 ENCOUNTER — Ambulatory Visit: Payer: Medicare Other

## 2020-07-25 ENCOUNTER — Encounter: Payer: Self-pay | Admitting: Family Medicine

## 2020-07-25 ENCOUNTER — Ambulatory Visit (INDEPENDENT_AMBULATORY_CARE_PROVIDER_SITE_OTHER): Payer: Medicare Other

## 2020-07-25 ENCOUNTER — Ambulatory Visit (INDEPENDENT_AMBULATORY_CARE_PROVIDER_SITE_OTHER): Payer: Medicare Other | Admitting: Family Medicine

## 2020-07-25 VITALS — BP 100/80 | HR 79 | Ht 67.0 in | Wt 150.0 lb

## 2020-07-25 DIAGNOSIS — M545 Low back pain, unspecified: Secondary | ICD-10-CM

## 2020-07-25 DIAGNOSIS — R079 Chest pain, unspecified: Secondary | ICD-10-CM

## 2020-07-25 DIAGNOSIS — G8929 Other chronic pain: Secondary | ICD-10-CM

## 2020-07-25 DIAGNOSIS — E559 Vitamin D deficiency, unspecified: Secondary | ICD-10-CM | POA: Diagnosis not present

## 2020-07-25 DIAGNOSIS — M999 Biomechanical lesion, unspecified: Secondary | ICD-10-CM

## 2020-07-25 NOTE — Assessment & Plan Note (Signed)
Encourage patient to have her vitamin D checked at an outside facility.  We also discussed that I think with patient's history that a bone density exam would be beneficial.

## 2020-07-25 NOTE — Patient Instructions (Addendum)
Good to see you Chest and sternum xray Keep watching for GI symptoms that could be contributing Ask other docs about possible IV for vitamin supplementation Might also consider muti vitamin trial at some point Ask other doctor about bone density Keep doing exercises 3 times a week is plenty See me again in 5-6 weeks

## 2020-07-25 NOTE — Assessment & Plan Note (Signed)
Still seems to be multifactorial.  Patient has made some improvement but now is having more sternal pain.  We will get x-rays of the chest as well as the sternum but I do not think that there is any true bony abnormality.  Patient does not is having difficulty with her diet still secondary to her stomach that does cause some potential concern for some vitamin deficiencies.  This could be contributing some more aching pain patient still is having difficulty with her GI symptoms and encouraged her to follow-up with her gastroenterology to discuss again the possibility of endoscopy.  Patient has been trying the muscle relaxer intermittently and still seems to be doing relatively well with this medication.  Encouraged her to follow-up for her regular labs in 2 weeks with her other providers.  As long as patient continues to make some improvement I do not think we can make other large changes but encouraged her to continue working with physical therapy and see me again in 6 to 8 weeks

## 2020-07-26 ENCOUNTER — Encounter: Payer: Self-pay | Admitting: Family Medicine

## 2020-07-29 ENCOUNTER — Encounter: Payer: Self-pay | Admitting: Family Medicine

## 2020-08-05 ENCOUNTER — Telehealth: Payer: Self-pay | Admitting: Family Medicine

## 2020-08-05 NOTE — Telephone Encounter (Signed)
I would have her talk to primary and discuss possible urology appointment  That still is not normal  Still o idea what to do for nutrition, really think we will need to try some type of vitamins again.  I am sorry don't have great ideas

## 2020-08-05 NOTE — Telephone Encounter (Signed)
Patient called to let Dr Tamala Julian know that her UTI symptoms have continued (based on their conversation at her last visit). She received the culture results back that did not show any infection but noted there was blood, protein and leukocytes present. Her urine is still very orange (she said it looked like clear orange juice). She said that her potassium dropped over the weekend and she has not been able to find a resource for nutritional assistance.   Please advise.

## 2020-08-06 NOTE — Telephone Encounter (Signed)
Spoke with patient who saw her PCP yesterday and has been referred to urology.

## 2020-08-07 ENCOUNTER — Encounter: Payer: Self-pay | Admitting: Family Medicine

## 2020-08-09 ENCOUNTER — Encounter: Payer: Self-pay | Admitting: Family Medicine

## 2020-08-23 NOTE — Progress Notes (Deleted)
   I, Peterson Lombard, LAT, ATC acting as a scribe for Lynne Leader, MD.  Bianca [name] is a 55 y.o. female who presents to [place] [facility] today for L knee pain ongoing for //.  Pt was previously seen by Dr. Tamala Julian on 07/25/20 for back and neck pain and was treated w/ OMT. Pt locate pain to    Pertinent review of systems: ***  Relevant historical information: ***   Exam:  There were no vitals taken for this visit. General: Well Developed, well nourished, and in no acute distress.   MSK: ***    Lab and Radiology Results No results found for this or any previous visit (from the past 72 hour(s)). No results found.     Assessment and Plan: 55 y.o. female with ***   PDMP not reviewed this encounter. No orders of the defined types were placed in this encounter.  No orders of the defined types were placed in this encounter.    Discussed warning signs or symptoms. Please see discharge instructions. Patient expresses understanding.   ***

## 2020-08-26 ENCOUNTER — Ambulatory Visit: Payer: Medicare Other | Admitting: Family Medicine

## 2020-08-28 NOTE — Progress Notes (Signed)
[facility] [place] Phone: [phone] Subjective:   Bianca [name], am serving as a scribe for Dr. Hulan Saas. This visit occurred during the SARS-CoV-2 public health emergency.  Safety protocols were in place, including screening questions prior to the visit, additional usage of staff PPE, and extensive cleaning of exam room while observing appropriate contact time as indicated for disinfecting solutions.  I'm seeing this patient by the request  of:  Teodoro Kil, PA-C  CC: Knee pain  [id number]  Bianca [name] is a 55 y.o. female coming in with complaint of back pain. OMT 07/25/2020. Patient reports nausea, dizziness and has had diarrhea for past 3 days. Eating peanut butter crackers as her diet. Has taken multiple anti-diarrheal medications and they are not helping.   Patient notes twisting knee due to feeling dizzy. Notes bruising on L adductor.  Patient states bruising worsened initially but now seems to stabilize.  He does have some difficulty bending the knee all the way or straightening it.  Denies any instability of the knee but states that continues to have more dizziness that she thinks is secondary to her chronic diarrhea.      Past Medical History:  Diagnosis Date  . Thyroid disease    Past Surgical History:  Procedure Laterality Date  . ADENOIDECTOMY    . TONSILLECTOMY     Social History   Socioeconomic History  . Marital status: Single    Spouse name: Not on file  . Number of children: Not on file  . Years of education: Not on file  . Highest education level: Not on file  Occupational History  . Not on file  Tobacco Use  . Smoking status: Never Smoker  . Smokeless tobacco: Not on file  Substance and Sexual Activity  . Alcohol use: [name]  . Drug use: [name]  . Sexual activity: Not on file  Other Topics Concern  . Not on file  Social History Narrative  . Not on file   Social Determinants  of Health   Financial Resource Strain: Not on file  Food Insecurity: Not on file  Transportation Needs: Not on file  Physical Activity: Not on file  Stress: Not on file  Social Connections: Not on file   Allergies  Allergen Reactions  . Atropine Sulfate Other (See Comments)    Low BP; numbness in both arms   . Beef (Bovine) Protein Anaphylaxis  . Justicia Adhatoda (Malabar Nut Tree) [Justicia Adhatoda] Hives  . Lac Bovis Anaphylaxis  . Sulfa Antibiotics Anaphylaxis  . Bactrim [Sulfamethoxazole-Trimethoprim]   . Shellfish Allergy    Family History  Problem Relation Age of Onset  . Parkinson's disease Father     Current Outpatient Medications (Endocrine & Metabolic):  .  levothyroxine (SYNTHROID, LEVOTHROID) 25 MCG tablet, Take 25 mcg by mouth daily.  Current Outpatient Medications (Cardiovascular):  .  midodrine (PROAMATINE) 2.5 MG tablet, Take by mouth at bedtime.  Current Outpatient Medications (Respiratory):  .  albuterol (VENTOLIN HFA) 108 (90 Base) MCG/ACT inhaler, Inhale 2 puffs into the lungs every 4 (four) hours as needed for wheezing or shortness of breath. .  beclomethasone (QVAR REDIHALER) 40 MCG/ACT inhaler, Inhale 1 puff into the lungs 2 (two) times daily. .  benzonatate (TESSALON) 200 MG capsule, Take 1 capsule (200 mg total) by mouth at bedtime.    Current Outpatient Medications (Other):  .  amoxicillin-clavulanate (AUGMENTIN) 875-125 MG per tablet, Take 1 tablet by mouth 2 (  two) times daily. Take with food .  loperamide (IMODIUM A-D) 2 MG tablet,  .  tiZANidine (ZANAFLEX) 2 MG tablet, Take 1 tablet (2 mg total) by mouth at bedtime.   Reviewed prior external information including notes and imaging from  primary care provider As well as notes that were available from care everywhere and other healthcare systems.  Past medical history, social, surgical and family history all reviewed in electronic medical record.  [name] information unless stated  regarding to the chief complaint.   Review of Systems:  [name] headache, visual changes,  vomiting,  constipation,  skin rash, fevers, chills, night sweats, weight loss, swollen lymph nodes, body aches, joint swelling, chest pain, shortness of breath, mood changes. POSITIVE muscle aches, diarrhea, dizziness, nausea, abdominal pain  Objective  Blood pressure 128/82, pulse 86, height 5\' 7"  (1.702 m), SpO2 99 %.   General: Patient is alert and oriented but does seem to be significantly anxious.  Patient is pale at baseline.  Does have some mild cold extremity of the hand. HEENT: Pupils equal, extraocular movements intact  Respiratory: Patient's speak in full sentences and does not appear short of breath  Cardiovascular: [name] lower extremity edema, non tender, [name] erythema, cool  Gait sitting in a wheelchair MSK: Left knee exam does have bruising noted on the medial aspect of the knee.  Patient does have mild lateral tracking of the patella with positive grind test.  [name] effusion noted in his knee and lacks the last 15 degrees of flexion.  Lacks the last 2 degrees of extension.    Impression and Recommendations:     The above documentation has been reviewed and is accurate and complete Lyndal Pulley, DO

## 2020-08-29 ENCOUNTER — Encounter: Payer: Self-pay | Admitting: Family Medicine

## 2020-08-29 ENCOUNTER — Emergency Department (HOSPITAL_COMMUNITY)
Admission: EM | Admit: 2020-08-29 | Discharge: 2020-08-30 | Disposition: A | Discharge status: Home / Self Care | Payer: Medicare Other | Attending: Emergency Medicine | Admitting: Emergency Medicine

## 2020-08-29 ENCOUNTER — Other Ambulatory Visit: Payer: Self-pay

## 2020-08-29 ENCOUNTER — Emergency Department (HOSPITAL_COMMUNITY): Payer: Medicare Other

## 2020-08-29 ENCOUNTER — Ambulatory Visit (INDEPENDENT_AMBULATORY_CARE_PROVIDER_SITE_OTHER): Payer: Medicare Other | Admitting: Family Medicine

## 2020-08-29 ENCOUNTER — Encounter (HOSPITAL_COMMUNITY): Payer: Self-pay | Admitting: Emergency Medicine

## 2020-08-29 DIAGNOSIS — E039 Hypothyroidism, unspecified: Secondary | ICD-10-CM | POA: Insufficient documentation

## 2020-08-29 DIAGNOSIS — Z79899 Other long term (current) drug therapy: Secondary | ICD-10-CM | POA: Diagnosis not present

## 2020-08-29 DIAGNOSIS — M25562 Pain in left knee: Secondary | ICD-10-CM

## 2020-08-29 DIAGNOSIS — E860 Dehydration: Secondary | ICD-10-CM | POA: Diagnosis not present

## 2020-08-29 DIAGNOSIS — R6883 Chills (without fever): Secondary | ICD-10-CM | POA: Diagnosis not present

## 2020-08-29 DIAGNOSIS — R197 Diarrhea, unspecified: Secondary | ICD-10-CM | POA: Diagnosis present

## 2020-08-29 DIAGNOSIS — C189 Malignant neoplasm of colon, unspecified: Secondary | ICD-10-CM | POA: Diagnosis not present

## 2020-08-29 DIAGNOSIS — R5383 Other fatigue: Secondary | ICD-10-CM | POA: Diagnosis not present

## 2020-08-29 DIAGNOSIS — Z85038 Personal history of other malignant neoplasm of large intestine: Secondary | ICD-10-CM | POA: Diagnosis not present

## 2020-08-29 DIAGNOSIS — C787 Secondary malignant neoplasm of liver and intrahepatic bile duct: Secondary | ICD-10-CM

## 2020-08-29 DIAGNOSIS — R1030 Lower abdominal pain, unspecified: Secondary | ICD-10-CM | POA: Diagnosis not present

## 2020-08-29 DIAGNOSIS — R42 Dizziness and giddiness: Secondary | ICD-10-CM

## 2020-08-29 DIAGNOSIS — E876 Hypokalemia: Secondary | ICD-10-CM | POA: Diagnosis not present

## 2020-08-29 LAB — URINALYSIS, ROUTINE W REFLEX MICROSCOPIC
Bacteria, UA: NONE SEEN
Bilirubin Urine: NEGATIVE
Glucose, UA: NEGATIVE mg/dL
Ketones, ur: 20 mg/dL — AB
Leukocytes,Ua: NEGATIVE
Nitrite: NEGATIVE
Protein, ur: NEGATIVE mg/dL
Specific Gravity, Urine: 1.013 (ref 1.005–1.030)
pH: 5 (ref 5.0–8.0)

## 2020-08-29 LAB — COMPREHENSIVE METABOLIC PANEL
ALT: 17 U/L (ref 0–44)
AST: 21 U/L (ref 15–41)
Albumin: 3.6 g/dL (ref 3.5–5.0)
Alkaline Phosphatase: 53 U/L (ref 38–126)
Anion gap: 12 (ref 5–15)
BUN: 18 mg/dL (ref 6–20)
CO2: 21 mmol/L — ABNORMAL LOW (ref 22–32)
Calcium: 8.9 mg/dL (ref 8.9–10.3)
Chloride: 102 mmol/L (ref 98–111)
Creatinine, Ser: 0.93 mg/dL (ref 0.44–1.00)
GFR, Estimated: >60 mL/min (ref >60)
Glucose, Bld: 72 mg/dL (ref 70–99)
Potassium: 3.8 mmol/L (ref 3.5–5.1)
Sodium: 135 mmol/L (ref 135–145)
Total Bilirubin: 1.6 mg/dL — ABNORMAL HIGH (ref 0.3–1.2)
Total Protein: 6.4 g/dL — ABNORMAL LOW (ref 6.5–8.1)

## 2020-08-29 LAB — CBC WITH DIFFERENTIAL/PLATELET
Abs Immature Granulocytes: 0.02 10*3/uL (ref 0.00–0.07)
Basophils Absolute: 0 10*3/uL (ref 0.0–0.1)
Basophils Relative: 1 %
Eosinophils Absolute: 0.1 10*3/uL (ref 0.0–0.5)
Eosinophils Relative: 1 %
HCT: 33.4 % — ABNORMAL LOW (ref 36.0–46.0)
Hemoglobin: 11.2 g/dL — ABNORMAL LOW (ref 12.0–15.0)
Immature Granulocytes: 0 %
Lymphocytes Relative: 24 %
Lymphs Abs: 1.7 10*3/uL (ref 0.7–4.0)
MCH: 30.9 pg (ref 26.0–34.0)
MCHC: 33.5 g/dL (ref 30.0–36.0)
MCV: 92 fL (ref 80.0–100.0)
Monocytes Absolute: 0.4 10*3/uL (ref 0.1–1.0)
Monocytes Relative: 5 %
Neutro Abs: 4.8 10*3/uL (ref 1.7–7.7)
Neutrophils Relative %: 69 %
Platelets: 171 10*3/uL (ref 150–400)
RBC: 3.63 MIL/uL — ABNORMAL LOW (ref 3.87–5.11)
RDW: 12.1 % (ref 11.5–15.5)
WBC: 7 10*3/uL (ref 4.0–10.5)
nRBC: 0 % (ref 0.0–0.2)

## 2020-08-29 LAB — LACTIC ACID, PLASMA: Lactic Acid, Venous: 0.9 mmol/L (ref 0.5–1.9)

## 2020-08-29 MED ORDER — SODIUM CHLORIDE 0.9 % IV BOLUS
1000.0000 mL | Freq: Once | INTRAVENOUS | Status: AC
  Administered 2020-08-29: 1000 mL via INTRAVENOUS

## 2020-08-29 NOTE — ED Notes (Signed)
Pt inquired whether she can have something to drink. Nurse notified

## 2020-08-29 NOTE — ED Triage Notes (Signed)
Pt reports constant diarrhea since Monday. Patient is in remission for stage 4 metastatic colon cancer. She states that she was recently on antibiotics for a UTI for 3 weeks. States that it doesn't really smell like c diff. But it smells like her chemo diarrhea. She has been trying pepto and immodium. Reports intermittent dizziness, near syncope, and balance changes. She also dislocated her knee earlier this week.

## 2020-08-29 NOTE — ED Provider Notes (Signed)
[facility] DEPT Provider Note   CSN: [id number] Arrival date & time: 08/29/20  1612     History Chief Complaint  Patient presents with  . Diarrhea    Bianca [name] is a 55 y.o. female.  Bianca [name] is a 55 y.o. female with a history of metastatic colon cancer in remission, thyroid disease, autonomic dysfunction, hypokalemia, and alpha gal allergy, who presents to the ED for evaluation of diarrhea.  She reports that during her cancer treatment she had severe chronic diarrhea and even after stopping treatment it took her quite a long time to get it under control but since December she had been doing much better until Monday when she started having diarrhea that has become more persistent over the past day.  She reports she has been having numerous mostly watery bowel movements.  She reports they have been dark but this typically happens when she takes Pepto-Bismol which she has been taking regularly, has not seen any blood in her stool.  She reports some intermittent lower abdominal pain associated with this.  She has been using Imodium, Pepto-Bismol and paregoric with little to no improvement in her diarrhea.  She has been feeling fatigued and weak with some lightheadedness.  Saw her sports medicine doctor today, for knee pain, but due to the symptoms he felt that she should present for further evaluation.  Patient has had some chills but no noted fevers.  Denies any vomiting but has been a little nauseated.  Has been trying to drink plenty of water to keep up with fluid losses.  With prior diarrhea she has had severe hypokalemia dehydration and has required hospitalization.        Past Medical History:  Diagnosis Date  . Thyroid disease     Patient Active Problem List   Diagnosis Date Noted  . Left knee pain 08/29/2020  . Vitamin B12 deficiency 03/12/2020  . Back pain 03/06/2020  . 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 Nonallopathic lesion of  thoracic region 03/06/2020  . Autonomic dysfunction 09/07/2019  . History of anaphylaxis 09/07/2019  . Personal history of colon cancer, stage IV 09/07/2019  . Allergy to alpha-gal 01/04/2019  . Hypokalemia 07/27/2018  . Metastatic colon cancer to liver ([place]) 04/30/2018  . Low ferritin 04/07/2017  . Menorrhagia with regular cycle 04/06/2017  . Vitamin D deficiency 04/18/2016  . Iron deficiency anemia due to chronic blood loss 04/15/2016  . Acquired hypothyroidism 03/13/2011    Past Surgical History:  Procedure Laterality Date  . ADENOIDECTOMY    . TONSILLECTOMY       OB History   No obstetric history on file.     Family History  Problem Relation Age of Onset  . Parkinson's disease Father     Social History   Tobacco Use  . Smoking status: Never Smoker  Substance Use Topics  . Alcohol use: No  . Drug use: No    Home Medications Prior to Admission medications   Medication Sig Start Date End Date Taking? Authorizing Provider  albuterol (VENTOLIN HFA) 108 (90 Base) MCG/ACT inhaler Inhale 2 puffs into the lungs every 4 (four) hours as needed for wheezing or shortness of breath. 03/06/20   Lyndal Pulley, DO  amoxicillin-clavulanate (AUGMENTIN) 875-125 MG per tablet Take 1 tablet by mouth 2 (two) times daily. Take with food 07/21/12   Kandra Nicolas, MD  beclomethasone (QVAR REDIHALER) 40 MCG/ACT inhaler Inhale 1 puff into the lungs 2 (two) times daily. 03/06/20  Hulan Saas M, DO  benzonatate (TESSALON) 200 MG capsule Take 1 capsule (200 mg total) by mouth at bedtime. 07/21/12   Kandra Nicolas, MD  levothyroxine (SYNTHROID, LEVOTHROID) 25 MCG tablet Take 25 mcg by mouth daily.    [provider]  loperamide (IMODIUM A-D) 2 MG tablet     [provider]  midodrine (PROAMATINE) 2.5 MG tablet Take by mouth at bedtime. 02/19/20   [provider]  tiZANidine (ZANAFLEX) 2 MG tablet Take 1 tablet (2 mg total) by mouth at bedtime. 05/14/20   Lyndal Pulley, DO    Allergies    Atropine sulfate, Beef (bovine) protein, Justicia adhatoda (malabar nut tree) [justicia adhatoda], Lac bovis, Sulfa antibiotics, Bactrim [sulfamethoxazole-trimethoprim], and Shellfish allergy  Review of Systems   Review of Systems  Constitutional: Positive for fatigue. Negative for chills and fever.  HENT: Negative.   Respiratory: Negative for cough and shortness of breath.   Cardiovascular: Negative for chest pain.  Gastrointestinal: Positive for abdominal pain and diarrhea. Negative for blood in stool, nausea and vomiting.  Genitourinary: Positive for hematuria. Negative for dysuria and frequency.  Musculoskeletal: Negative for arthralgias and myalgias.  Skin: Negative for color change and rash.  Neurological: Positive for light-headedness. Negative for syncope.  All other systems reviewed and are negative.   Physical Exam Updated Vital Signs BP 110/60   Pulse 87   Temp 99.7 F (37.6 C) (Oral)   Resp 11   SpO2 100%   Physical Exam Vitals and nursing note reviewed.  Constitutional:      General: She is not in acute distress.    Appearance: Normal appearance. She is well-developed. She is not diaphoretic.     Comments: Alert, chronically ill-appearing but in no acute distress  HENT:     Head: Normocephalic and atraumatic.     Mouth/Throat:     Mouth: Mucous membranes are moist.     Pharynx: Oropharynx is clear.  Eyes:     General:        Right eye: No discharge.        Left eye: No discharge.  Cardiovascular:     Rate and Rhythm: Normal rate and regular rhythm.     Heart sounds: Normal heart sounds. No murmur heard. No friction rub. No gallop.   Pulmonary:     Effort: Pulmonary effort is normal. No respiratory distress.     Breath sounds: Normal breath sounds. No wheezing or rales.     Comments: Respirations equal and unlabored, patient able to speak in full sentences, lungs clear to auscultation bilaterally Abdominal:     General:  Bowel sounds are normal. There is no distension.     Palpations: Abdomen is soft. There is no mass.     Tenderness: There is abdominal tenderness. There is no guarding.     Comments: Abdomen is soft, nondistended, prior surgical scars noted, bowel sounds present throughout, slightly hyperactive, there is some mild tenderness across the lower abdomen, all other quadrants nontender, no peritoneal signs  Musculoskeletal:        General: No deformity.     Cervical back: Neck supple.     Right lower leg: No edema.     Left lower leg: No edema.  Skin:    General: Skin is warm and dry.     Capillary Refill: Capillary refill takes less than 2 seconds.  Neurological:     Mental Status: She is alert and oriented to person, place, and time.  Coordination: Coordination normal.     Comments: Speech is clear, able to follow commands Moves extremities without ataxia, coordination intact  Psychiatric:        Mood and Affect: Mood normal.        Behavior: Behavior normal.     ED Results / Procedures / Treatments   Labs (all labs ordered are listed, but only abnormal results are displayed) Labs Reviewed  COMPREHENSIVE METABOLIC PANEL - Abnormal; Notable for the following components:      Result Value   CO2 21 (*)    Total Protein 6.4 (*)    Total Bilirubin 1.6 (*)    All other components within normal limits  CBC WITH DIFFERENTIAL/PLATELET - Abnormal; Notable for the following components:   RBC 3.63 (*)    Hemoglobin 11.2 (*)    HCT 33.4 (*)    All other components within normal limits  URINALYSIS, ROUTINE W REFLEX MICROSCOPIC - Abnormal; Notable for the following components:   Hgb urine dipstick MODERATE (*)    Ketones, ur 20 (*)    All other components within normal limits  URINE CULTURE  CULTURE, BLOOD (ROUTINE X 2)  CULTURE, BLOOD (ROUTINE X 2)  GASTROINTESTINAL PANEL BY PCR, STOOL (REPLACES STOOL CULTURE)  C DIFFICILE QUICK SCREEN W PCR REFLEX  LACTIC ACID, PLASMA  LACTIC  ACID, PLASMA    EKG EKG Interpretation  Date/Time:  Thursday August 29 2020 16:56:16 EDT Ventricular Rate:  87 PR Interval:  164 QRS Duration: 84 QT Interval:  317 QTC Calculation: 382 R Axis:   32 Text Interpretation: Sinus rhythm Borderline repolarization abnormality No significant change since last tracing Confirmed by Wandra Arthurs [id number]) on 08/29/2020 5:31:54 PM   Radiology CT ABDOMEN PELVIS WO CONTRAST  Result Date: 08/29/2020 CLINICAL DATA:  55 year old with personal history of metastatic colon cancer, presenting with chronic diarrhea, generalized abdominal pain and fever. EXAM: CT ABDOMEN AND PELVIS WITHOUT CONTRAST TECHNIQUE: Multidetector CT imaging of the abdomen and pelvis was performed following the standard protocol without IV contrast. COMPARISON:  None. FINDINGS: The lack of intravenous contrast makes the examination less sensitive. Lower chest: Heart size normal.  Visualized lung bases clear. Hepatobiliary: Normal unenhanced appearance of the liver. Surgically absent gallbladder. No unexpected biliary ductal dilation. Pancreas: Normal unenhanced appearance. Spleen: Normal unenhanced appearance. Adrenals/Urinary Tract: Normal appearing adrenal glands. No evidence of urinary tract calculi. Within the limits of the unenhanced technique, no focal parenchymal abnormality involving either kidney. No evidence of hydronephrosis involving either kidney. Normal appearing urinary bladder. Stomach/Bowel: Stomach normal in appearance for the degree of distention. Normal-appearing small bowel. Prior RIGHT hemicolectomy with an ileal-transverse colon anastomosis in the RIGHT UPPER QUADRANT. Short-segment narrowing of the proximal rectum over an approximate 2-2.5 cm length. Remainder of the colon normal in appearance. Appendix surgically absent. Vascular/Lymphatic: Moderate to severe atherosclerosis involving the distal abdominal aorta for age. No evidence of aneurysm. No pathologic  lymphadenopathy. Reproductive: Normal-appearing retroverted uterus. Normal ovaries. No adnexal masses. Other: Thinning of the supraumbilical rectus sheath without evidence of abdominal wall hernia. Musculoskeletal: Regional skeleton unremarkable without acute or significant osseous abnormality. IMPRESSION: 1. Short-segment narrowing of the proximal rectum over an approximate 2-2.5 cm length. While this may just be due to underdistention or peristalsis, a mass is not excluded. Please correlate with sigmoidoscopy. 2. No acute abnormalities involving the abdomen or pelvis otherwise. 3. Aortic Atherosclerosis (ICD10-I70.0). Electronically Signed   By: Evangeline Dakin M.D.   On: 08/29/2020 21:04    Procedures Procedures  Medications Ordered in ED Medications  sodium chloride 0.9 % bolus 1,000 mL (1,000 mLs Intravenous New Bag/Given 08/29/20 2244)    ED Course  I have reviewed the triage vital signs and the nursing notes.  Pertinent labs & imaging results that were available during my care of the patient were reviewed by me and considered in my medical decision making (see chart for details).    MDM Rules/Calculators/A&P                         55 y.o. female presents to the ED with complaints of diarrhea, this involves an extensive number of treatment options, and is a complaint that carries with it a high risk of complications and morbidity.  The differential diagnosis includes C. difficile, colitis, gastroenteritis, infectious diarrhea.  Concerned diarrhea could also lead to dehydration, AKI, hypokalemia or other electrolyte derangements.  On arrival pt is nontoxic, initially had a low-grade temp, but on recheck without intervention this was improved, all other vitals normal. Exam significant for some tenderness across the lower abdomen without guarding or peritoneal signs  Additional history obtained from medical record. Previous records obtained and reviewed via EMR  I ordered IV fluids for  diarrhea and dehydration  Lab Tests:  I Ordered, reviewed, and interpreted labs, which included:  CBC: No leukocytosis, stable hemoglobin CMP: CO2 of 21, but no other electrolyte derangements, specifically normal potassium in the setting of diarrhea, normal renal and liver function aside from slightly elevated T bili, no focal right upper quadrant tenderness. Lactic acid: Normal Given low-grade fever on arrival and patient's complex past medical history blood culture sent as well. UA: Some RBCs and a few WBCs noted but no bacteria, this is improved and likely suggest resolving inflammation from UTI.  C. difficile and GI pathogen panel pending  Imaging Studies ordered:  I ordered imaging studies which included CT abdomen pelvis, this was done without contrast per patient's request she reports that contrast makes her feel incredibly nauseous and sick, I independently visualized and interpreted imaging which showed short segment narrowing of the proximal rectum, could just be due to under distention or peristalsis but mass is not excluded, no other acute abnormalities involving the abdomen or pelvis noted.  I discussed these findings with the patient and will have her follow-up with her GI doctor for sigmoidoscopy to reassess narrowing of rectum,  ED Course:   Fortunately patient's lab work and CT have overall been reassuring.  C. difficile is pending as well as GI pathogen panel.  If C. difficile is negative patient can be discharged home with continued supportive management of her symptoms with Imodium and other medication she is used to manage her diarrhea previously.  If she is positive for C. difficile she will need to be started on antibiotics but can likely be treated as an outpatient.  At shift change care signed out to [place] who will follow up on C. Diff and dispo appropriately.  Portions of this note were generated with Lobbyist. Dictation errors may occur  despite best attempts at proofreading.   Final Clinical Impression(s) / ED Diagnoses Final diagnoses:  Diarrhea, unspecified type    Rx / DC Orders ED Discharge Orders    None       Janet Berlin 08/30/20 Ralene Muskrat, MD 09/03/20 [phone]

## 2020-08-29 NOTE — ED Provider Notes (Incomplete)
[facility] DEPT Provider Note   CSN: [id number] Arrival date & time: 08/29/20  1612     History No chief complaint on file.   Bianca [name] is a 55 y.o. female.  HPI     Past Medical History:  Diagnosis Date  . Thyroid disease     Patient Active Problem List   Diagnosis Date Noted  . Left knee pain 08/29/2020  . Vitamin B12 deficiency 03/12/2020  . Back pain 03/06/2020  . 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 Nonallopathic lesion of thoracic region 03/06/2020  . Autonomic dysfunction 09/07/2019  . History of anaphylaxis 09/07/2019  . Personal history of colon cancer, stage IV 09/07/2019  . Allergy to alpha-gal 01/04/2019  . Hypokalemia 07/27/2018  . Metastatic colon cancer to liver ([place]) 04/30/2018  . Low ferritin 04/07/2017  . Menorrhagia with regular cycle 04/06/2017  . Vitamin D deficiency 04/18/2016  . Iron deficiency anemia due to chronic blood loss 04/15/2016  . Acquired hypothyroidism 03/13/2011    Past Surgical History:  Procedure Laterality Date  . ADENOIDECTOMY    . TONSILLECTOMY       OB History   No obstetric history on file.     Family History  Problem Relation Age of Onset  . Parkinson's disease Father     Social History   Tobacco Use  . Smoking status: Never Smoker  Substance Use Topics  . Alcohol use: No  . Drug use: No    Home Medications Prior to Admission medications   Medication Sig Start Date End Date Taking? Authorizing Provider  albuterol (VENTOLIN HFA) 108 (90 Base) MCG/ACT inhaler Inhale 2 puffs into the lungs every 4 (four) hours as needed for wheezing or shortness of breath. 03/06/20   Lyndal Pulley, DO  amoxicillin-clavulanate (AUGMENTIN) 875-125 MG per tablet Take 1 tablet by mouth 2 (two) times daily. Take with food 07/21/12   Kandra Nicolas, MD  beclomethasone (QVAR REDIHALER) 40 MCG/ACT inhaler Inhale 1 puff into the lungs 2 (two) times daily. 03/06/20   Lyndal Pulley, DO   benzonatate (TESSALON) 200 MG capsule Take 1 capsule (200 mg total) by mouth at bedtime. 07/21/12   Kandra Nicolas, MD  levothyroxine (SYNTHROID, LEVOTHROID) 25 MCG tablet Take 25 mcg by mouth daily.    [provider]  loperamide (IMODIUM A-D) 2 MG tablet     [provider]  midodrine (PROAMATINE) 2.5 MG tablet Take by mouth at bedtime. 02/19/20   [provider]  tiZANidine (ZANAFLEX) 2 MG tablet Take 1 tablet (2 mg total) by mouth at bedtime. 05/14/20   Lyndal Pulley, DO    Allergies    Atropine sulfate, Beef (bovine) protein, Justicia adhatoda (malabar nut tree) [justicia adhatoda], Lac bovis, Sulfa antibiotics, Bactrim [sulfamethoxazole-trimethoprim], and Shellfish allergy  Review of Systems   Review of Systems  Physical Exam Updated Vital Signs There were no vitals taken for this visit.  Physical Exam  ED Results / Procedures / Treatments   Labs (all labs ordered are listed, but only abnormal results are displayed) Labs Reviewed - No data to display  EKG None  Radiology No results found.  Procedures Procedures {Remember to document critical care time when appropriate:1}  Medications Ordered in ED Medications - No data to display  ED Course  I have reviewed the triage vital signs and the nursing notes.  Pertinent labs & imaging results that were available during my care of the patient were reviewed by me and considered in  my medical decision making (see chart for details).    MDM Rules/Calculators/A&P                         55 y.o. female presents to the ED with complaints of ***, this involves an extensive number of treatment options, and is a complaint that carries with it a high risk of complications and morbidity.  The differential diagnosis includes *** (Ddx)  On arrival pt is nontoxic, vitals ***. Exam significant for ***  Additional history obtained from ***. Previous records obtained and reviewed ***  I ordered medication  *** for ***  Lab Tests:  I Ordered, reviewed, and interpreted labs, which included:   Imaging Studies ordered:  I ordered imaging studies which included , I independently visualized and interpreted imaging which showed ***  ED Course:   Fortunately patient's lab work and CT have overall been reassuring.  C. difficile is pending as well as GI pathogen panel.  If C. difficile is negative patient can be discharged home with continued supportive management of her symptoms with Imodium and other medication she is used to manage her diarrhea previously.  If she is positive for C. difficile she will need to be started on antibiotics but can likely be treated as an outpatient.  At shift change care signed out to [place] who will follow up on C. Diff and dispo appropriately.  Portions of this note were generated with Lobbyist. Dictation errors may occur despite best attempts at proofreading.   Final Clinical Impression(s) / ED Diagnoses Final diagnoses:  Diarrhea, unspecified type    Rx / DC Orders ED Discharge Orders    None

## 2020-08-29 NOTE — Discharge Instructions (Addendum)
Thank you for allowing me to care for you today in the Emergency Department.   Please continue to drink plenty of fluids since you have been having diarrhea.  You can use your home Zofran for nausea.  Take your other home medications as prescribed.  Since you have been having episodes of dizziness and feeling off balance, you should follow-up with your primary outpatient care team to have an MRI of your brain since you did not stay to have this today in the emergency department.  Please call their office to schedule a follow-up appointment.  Return to the emergency department if you have another fall or injury, if you hit your head, if you pass out, if you stop making urine, if you have severe, uncontrollable abdominal pain, or other new, concerning symptoms.

## 2020-08-29 NOTE — ED Provider Notes (Signed)
55 year old female received in signout from [place] pending C. difficile results.  Per her HPI:  "Bianca [name] is a 55 y.o. female with a history of metastatic colon cancer in remission, thyroid disease, autonomic dysfunction, hypokalemia, and alpha gal allergy, who presents to the ED for evaluation of diarrhea.  She reports that during her cancer treatment she had severe chronic diarrhea and even after stopping treatment it took her quite a long time to get it under control but since December she had been doing much better until Monday when she started having diarrhea that has become more persistent over the past day.  She reports she has been having numerous mostly watery bowel movements.  She reports they have been dark but this typically happens when she takes Pepto-Bismol which she has been taking regularly, has not seen any blood in her stool.  She reports some intermittent lower abdominal pain associated with this.  She has been using Imodium, Pepto-Bismol and paregoric with little to no improvement in her diarrhea.  She has been feeling fatigued and weak with some lightheadedness.  Saw her sports medicine doctor today, for knee pain, but due to the symptoms he felt that she should present for further evaluation.  Patient has had some chills but no noted fevers.  Denies any vomiting but has been a little nauseated.  Has been trying to drink plenty of water to keep up with fluid losses.  With prior diarrhea she has had severe hypokalemia dehydration and has required hospitalization." Physical Exam  BP 110/60   Pulse 87   Temp 99.7 F (37.6 C) (Oral)   Resp 11   SpO2 100%   Physical Exam Vitals and nursing note reviewed.  Constitutional:      Appearance: She is well-developed.  HENT:     Head: Normocephalic and atraumatic.  Eyes:     Extraocular Movements: Extraocular movements intact.     Pupils: Pupils are equal, round, and reactive to light.  Musculoskeletal:        General: Normal  range of motion.     Cervical back: Normal range of motion.  Neurological:     Mental Status: She is alert and oriented to person, place, and time.     ED Course/Procedures     Procedures  MDM  55 year old female received a signout from [place] pending reevaluation after C. difficile results.  Please see her note for further work-up and medical decision making.  C. difficile test is negative.  On reevaluation, the patient reports that she is feeling improved after IV fluids.  She expresses concern over diarrhea as she has been feeling lightheaded and dizzy.  She elaborates and states for the last 3 weeks that she was treated for UTI that she has been having intermittent episodes of room spinning dizziness.  Sometimes the episodes will last for hours at a time.  She reports associated disequilibrium and feels " like him in a bouncy house" with the episodes.  She has almost fallen several times, and earlier this week actually fell onto her left knee.  She also notes that she had difficulty for several days anytime she would go out side into the sunlight and come back into her home.  She states that her vision would not "adjust" and sometimes it would take up to 15 minutes before her vision returned to baseline.  She states that she has not noticed this recently, but reports that she has not been feeling well over the last few  days due to her other symptoms that prompted her ER visit today and has not been outside.  She denies any dizziness, numbness, weakness, slurred speech, ataxia, disequilibrium at this time.  I discussed with the patient that further imaging is warranted, including MRI and likely angiography as there is concern for TIA versus CVA.  Could also consider intracranial mass.  Patient initially seemed agreeable, but then notified RN that she had called her ride to come and collect her.  She will plan to follow-up with her outpatient team to have further imaging done on an outpatient  basis.  I discussed the patient with Dr. Karle Starch, attending physician who is in agreement with work-up and plan.  She is otherwise hemodynamic stable stable no acute distress.  Safe for discharge home at this time.      Joanne Gavel, PA-C 08/30/20 [id number]    Truddie Hidden, MD 08/30/20 [phone]

## 2020-08-29 NOTE — Assessment & Plan Note (Signed)
Patient had left knee pain.  Patient did twist her knee.  Patient does have unfortunately effusion of the joint noted.  We discussed with patient that I feel that this is likely more of a subluxation of the patella.  Tru pull lite brace given, x-rays pending.  Due to the patient seeming to have more difficulty with the diarrhea and the dizziness and would like patient to be seen more in the emergency room at this moment and we will deal with patient's pain of the knee later.  Patient is in agreement with the plan.

## 2020-08-29 NOTE — Assessment & Plan Note (Signed)
Patient has had a history of metastatic colon cancer.  Patient I am concerned with patient having the chronic diarrhea.  Patient does look a little bit pale at this moment and has had a urinary tract infection recently.  Encourage patient to go to the emergency room for further evaluation.  We discussed we could do laboratory work-up here but once again I do think patient likely will need at least some IV fluids.  Patient is in agreement.  Patient has a driver.  Would like to go in a private car.  Patient's vitals are stable and no sign of true orthostatics at the moment.  Patient will be headed over there now.

## 2020-08-29 NOTE — Patient Instructions (Signed)
Tru pull lite Arnica lotion Ice See me again in 5- 6 weeks but send message after seen in ED

## 2020-08-30 LAB — C DIFFICILE QUICK SCREEN W PCR REFLEX
C Diff antigen: NEGATIVE
C Diff interpretation: NOT DETECTED
C Diff toxin: NEGATIVE

## 2020-08-30 NOTE — ED Notes (Signed)
Patient has port a cath inserted in 2020. RN accessed port 08/29/2020 so patient could receive medication, fluids and obtain blood work. A 20G needle used with good blood return. Patient is being discharged now so RN deaccesed port with no issues. 4x4 placed over port

## 2020-08-31 LAB — GASTROINTESTINAL PANEL BY PCR, STOOL (REPLACES STOOL CULTURE)

## 2020-08-31 LAB — URINE CULTURE: Culture: <10000 — AB

## 2020-09-01 ENCOUNTER — Encounter: Payer: Self-pay | Admitting: Family Medicine

## 2020-09-03 LAB — CULTURE, BLOOD (ROUTINE X 2)
Culture: NO GROWTH
Culture: NO GROWTH
Special Requests: ADEQUATE
Special Requests: ADEQUATE

## 2020-09-17 NOTE — Progress Notes (Signed)
[facility] Phone: [phone] Subjective:   [name], am serving as a scribe for Dr. Hulan Saas. This visit occurred during the SARS-CoV-2 public health emergency.  Safety protocols were in place, including screening questions prior to the visit, additional usage of staff PPE, and extensive cleaning of exam room while observing appropriate contact time as indicated for disinfecting solutions.   I'm seeing this patient by the request  of:  Teodoro Kil, PA-C  CC: knee pain back and pelvis pain   PNT:[id number]   08/29/2020 Patient had left knee pain.  Patient did twist her knee.  Patient does have unfortunately effusion of the joint noted.  We discussed with patient that I feel that this is likely more of a subluxation of the patella.  Tru pull lite brace given, x-rays pending.  Due to the patient seeming to have more difficulty with the diarrhea and the dizziness and would like patient to be seen more in the emergency room at this moment and we will deal with patient's pain of the knee later.  Patient is in agreement with the plan.  Update 09/18/2020 Bianca [name] is a 55 y.o. female coming in with complaint of L knee, hip and ankle pain. States that she twisted knee and hip and foot also were injured at same time.   Feels pain in pubic symphysis radiates into L SI joint. Pain is constant.   Pain in left talus/calcaneous. Using voltaren gel which helps to reduce her pain.  Patient states that its only been since she is hurt her knee.   Patient at last visit was significantly dehydrated and seem to be having more difficulty with her stomach.  We sent her to the emergency room and then patient was admitted to the hospital back at [facility].  Continued to have low blood pressure that they believed was secondary to short gut syndrome.  Patient states that she is feeling a little better from this.  Was found to have a  UTI.  He has been treated "continues to have some discomfort more in the pelvic area.  Patient states that it seems to be more muscular.    Past Medical History:  Diagnosis Date  . Thyroid disease    Past Surgical History:  Procedure Laterality Date  . ADENOIDECTOMY    . TONSILLECTOMY     Social History   Socioeconomic History  . Marital status: Single    Spouse name: Not on file  . Number of children: Not on file  . Years of education: Not on file  . Highest education level: Not on file  Occupational History  . Not on file  Tobacco Use  . Smoking status: Never Smoker  . Smokeless tobacco: Not on file  Substance and Sexual Activity  . Alcohol use: No  . Drug use: No  . Sexual activity: Not on file  Other Topics Concern  . Not on file  Social History Narrative  . Not on file   Social Determinants of Health   Financial Resource Strain: Not on file  Food Insecurity: Not on file  Transportation Needs: Not on file  Physical Activity: Not on file  Stress: Not on file  Social Connections: Not on file   Allergies  Allergen Reactions  . Atropine Sulfate Other (See Comments)    Low BP; numbness in both arms   . Beef (Bovine) Protein Anaphylaxis  . Justicia Adhatoda ([place]) Conan Bowens  Adhatoda] Hives  . Lac Bovis Anaphylaxis  . Sulfa Antibiotics Anaphylaxis  . Bactrim [Sulfamethoxazole-Trimethoprim]   . Shellfish Allergy    Family History  Problem Relation Age of Onset  . Parkinson's disease Father     Current Outpatient Medications (Endocrine & Metabolic):  .  levothyroxine (SYNTHROID, LEVOTHROID) 25 MCG tablet, Take 25 mcg by mouth daily.  Current Outpatient Medications (Cardiovascular):  .  midodrine (PROAMATINE) 2.5 MG tablet, Take by mouth at bedtime.  Current Outpatient Medications (Respiratory):  .  albuterol (VENTOLIN HFA) 108 (90 Base) MCG/ACT inhaler, Inhale 2 puffs into the lungs every 4 (four) hours as needed for wheezing or shortness of  breath. .  beclomethasone (QVAR REDIHALER) 40 MCG/ACT inhaler, Inhale 1 puff into the lungs 2 (two) times daily. .  benzonatate (TESSALON) 200 MG capsule, Take 1 capsule (200 mg total) by mouth at bedtime.    Current Outpatient Medications (Other):  .  amoxicillin-clavulanate (AUGMENTIN) 875-125 MG per tablet, Take 1 tablet by mouth 2 (two) times daily. Take with food .  loperamide (IMODIUM A-D) 2 MG tablet,  .  tiZANidine (ZANAFLEX) 2 MG tablet, Take 1 tablet (2 mg total) by mouth at bedtime.   Reviewed prior external information including notes and imaging from  primary care provider As well as notes that were available from care everywhere and other healthcare systems.  Past medical history, social, surgical and family history all reviewed in electronic medical record.  No pertanent information unless stated regarding to the chief complaint.   Review of Systems:  No headache, visual changes, nausea, vomiting, diarrhea, constipation, dizziness,  skin rash, fevers, chills, night sweats, weight loss, swollen lymph nodes, , joint swelling, chest pain, shortness of breath, mood changes. POSITIVE muscle aches, body aches, abdominal pain  Objective  Blood pressure 96/72, pulse 91, height 5\' 7"  (1.702 m), SpO2 99 %.   General: No apparent distress alert and oriented x3 mood and affect normal, dressed appropriately.  HEENT: Pupils equal, extraocular movements intact  Respiratory: Patient's speak in full sentences and does not appear short of breath  Cardiovascular: No lower extremity edema, non tender, no erythema  Gait antalgic favoring the left. Patient's stomach is minorly more distended than her baseline.  Patient though does not have any rebound or any involuntary guarding noted. MSK:  Non tender with full range of motion and good stability and symmetric strength and tone of should patient does have some tightness noted on the left sacroiliac joint.  Tightness around the pubic symphysis  noted as well.  Right hip though does have fairly good range of motion.  Osteopathic findings T5 extended rotated and side bent right with inhaled rib L2 flexed rotated and side bent right Sacrum left on left Pelvic shear noted with left posterior ilium   Impression and Recommendations:     The above documentation has been reviewed and is accurate and complete Lyndal Pulley, DO

## 2020-09-18 ENCOUNTER — Encounter: Payer: Self-pay | Admitting: Family Medicine

## 2020-09-18 ENCOUNTER — Ambulatory Visit (INDEPENDENT_AMBULATORY_CARE_PROVIDER_SITE_OTHER): Payer: Medicare Other | Admitting: Family Medicine

## 2020-09-18 ENCOUNTER — Other Ambulatory Visit: Payer: Self-pay

## 2020-09-18 VITALS — BP 96/72 | HR 91 | Ht 67.0 in

## 2020-09-18 DIAGNOSIS — M9903 Segmental and somatic dysfunction of lumbar region: Secondary | ICD-10-CM | POA: Diagnosis not present

## 2020-09-18 DIAGNOSIS — M9908 Segmental and somatic dysfunction of rib cage: Secondary | ICD-10-CM | POA: Diagnosis not present

## 2020-09-18 DIAGNOSIS — M9905 Segmental and somatic dysfunction of pelvic region: Secondary | ICD-10-CM

## 2020-09-18 DIAGNOSIS — M9904 Segmental and somatic dysfunction of sacral region: Secondary | ICD-10-CM

## 2020-09-18 DIAGNOSIS — M545 Low back pain, unspecified: Secondary | ICD-10-CM | POA: Diagnosis not present

## 2020-09-18 DIAGNOSIS — M9902 Segmental and somatic dysfunction of thoracic region: Secondary | ICD-10-CM | POA: Diagnosis not present

## 2020-09-18 DIAGNOSIS — M25562 Pain in left knee: Secondary | ICD-10-CM | POA: Diagnosis not present

## 2020-09-18 DIAGNOSIS — G8929 Other chronic pain: Secondary | ICD-10-CM

## 2020-09-18 NOTE — Assessment & Plan Note (Signed)
Patient may have some mild meniscal injury.  Patient does seem to be improving already.  Has a different brace that she thinks is more beneficial than the one we gave her.  Discussed avoiding twisting motions for short course of time.  Follow-up again in 5 to 6 weeks.  Continuing to have pain advanced imaging could be warranted.

## 2020-09-18 NOTE — Patient Instructions (Signed)
Ice knee at night Heel lift in shoe See me in 5-6 weeks

## 2020-09-18 NOTE — Assessment & Plan Note (Signed)
Back and pelvic pain.  Significant more secondary to the sacroiliac joint and the pelvic symphysis.  We did finally get patient to have good range of motion.  I believe the patient is compensating a little bit secondary to her knee.  Patient did have a knee effusion previously on x-rays but seems to have resolved on the ultrasound today.  Discussed with patient about icing regimen, home exercises, patient will follow up with me again in 5 to 6 weeks.  He does have Zanaflex for breakthrough pain.

## 2020-10-09 NOTE — Progress Notes (Signed)
[place] [address] [place] Phone: [phone] Subjective:   I Bianca [name] am serving as a Education administrator for Dr. Hulan Saas.  This visit occurred during the SARS-CoV-2 public health emergency.  Safety protocols were in place, including screening questions prior to the visit, additional usage of staff PPE, and extensive cleaning of exam room while observing appropriate contact time as indicated for disinfecting solutions.   I'm seeing this patient by the request  of:  Teodoro Kil, PA-C  CC: Neck and back pain follow-up, left knee pain follow up   VOZ:[id number]  Bianca [name] is a 55 y.o. female coming in with complaint of back and neck pain. OMT 09/18/2020. Patient states her back has been getting worse since being in the hospital. The hip is somewhat better than last time. Knee is still painful but not as bad as before.  Patient feels like she is making some response.  Feels like some of the diet changes she has made has made a little bit of improvement but now she is concerned with some of the weight gain she has had.  Medications patient has been prescribed: None  Taking:    Patient's left knee seem to have more of a meniscal injury.  Patient given some exercises and wants to avoid certain activities.  Patient states overall feels like she is making progress.  Has not been giving out on her.  Has not noticed the swelling.     Reviewed prior external information including notes and imaging from previsou exam, outside providers and external EMR if available.   As well as notes that were available from care everywhere and other healthcare systems.  Past medical history, social, surgical and family history all reviewed in electronic medical record.  No pertanent information unless stated regarding to the chief complaint.   Past Medical History:  Diagnosis Date  . Thyroid disease     Allergies  Allergen Reactions  . Atropine  Sulfate Other (See Comments)    Low BP; numbness in both arms   . Beef (Bovine) Protein Anaphylaxis  . Justicia Adhatoda ([place]) [Justicia Adhatoda] Hives  . Lac Bovis Anaphylaxis  . Sulfa Antibiotics Anaphylaxis  . Bactrim [Sulfamethoxazole-Trimethoprim]   . Shellfish Allergy      Review of Systems:  No headache, visual changes, nausea, vomiting, diarrhea, constipation, dizziness,  skin rash, fevers, chills, night sweats, weight loss, swollen lymph nodes,  joint swelling, chest pain, shortness of breath, mood changes. POSITIVE muscle aches, body aches, abdominal pain patient does state though that her bowel movements have been improved.  Objective  Blood pressure 100/78, pulse 76, height 5\' 7"  (1.702 m), weight 153 lb (69.4 kg), SpO2 97 %.   General: No apparent distress alert and oriented x3 mood and affect normal, dressed appropriately.  Mild distention of the abdomen still noted. HEENT: Pupils equal, extraocular movements intact  Respiratory: Patient's speak in full sentences and does not appear short of breath  Cardiovascular: No lower extremity edema, non tender, no erythema  Back -low back exam does have some mild loss of lordosis.  Some tenderness to palpation in the paraspinal musculature of the lumbar spine left greater than right.  Tenderness over the left sacroiliac joint. Patient does have some mild increase in discomfort with internal rotation of the left hip.  Osteopathic findings   T4 extended rotated and side bent right inhaled rib L2 flexed rotated and side bent right Sacrum right on right Pelvic  shear noted with left posterior ilium      Assessment and Plan:  Left knee pain Stable at the moment.  Patient has good range of motion.  Negative McMurray's.  Mild lateral tracking of the patella noted.  Discussed with patient that we could consider the possibility of injection.  Patient declined feeling like she is making progress and will continue with  conservative therapy.  Back pain Back pain with left hip pain.  Patient is seen multiple different doctors.  We are having her responded fairly well to osteopathic manipulation.  Hopefully this will continue to make some changes.  I do believe that this is more multifactorial and glad that this is helping though we have discussed that this is not the only thing that patient needs.  Continue to work on core strength, home exercises.  To keep [place] as well as her other physicians aware for any type of changes in her health.  Patient was having some sternal pain again and we discussed further work-up including the CT scan but she is due for her In June.  Patient did have an abnormality noted on the manubrium that should be further evaluated at that time.  Patient will follow up with me again in 6 to 8 weeks    Nonallopathic problems  Decision today to treat with OMT was based on Physical Exam  After verbal consent patient was treated with HVLA, ME, FPR techniques in pelvis, thoracic, lumbar, and sacral  areas  Patient tolerated the procedure well with improvement in symptoms  Patient given exercises, stretches and lifestyle modifications  See medications in patient instructions if given  Patient will follow up in 6-8 weeks      The above documentation has been reviewed and is accurate and complete Lyndal Pulley, DO       Note: This dictation was prepared with Dragon dictation along with smaller phrase technology. Any transcriptional errors that result from this process are unintentional.

## 2020-10-10 ENCOUNTER — Encounter: Payer: Self-pay | Admitting: Family Medicine

## 2020-10-10 ENCOUNTER — Other Ambulatory Visit: Payer: Self-pay

## 2020-10-10 ENCOUNTER — Ambulatory Visit (INDEPENDENT_AMBULATORY_CARE_PROVIDER_SITE_OTHER): Payer: Medicare Other | Admitting: Family Medicine

## 2020-10-10 VITALS — BP 100/78 | HR 76 | Ht 67.0 in | Wt 153.0 lb

## 2020-10-10 DIAGNOSIS — M545 Low back pain, unspecified: Secondary | ICD-10-CM

## 2020-10-10 DIAGNOSIS — M9903 Segmental and somatic dysfunction of lumbar region: Secondary | ICD-10-CM | POA: Diagnosis not present

## 2020-10-10 DIAGNOSIS — M9905 Segmental and somatic dysfunction of pelvic region: Secondary | ICD-10-CM

## 2020-10-10 DIAGNOSIS — M9904 Segmental and somatic dysfunction of sacral region: Secondary | ICD-10-CM

## 2020-10-10 DIAGNOSIS — C787 Secondary malignant neoplasm of liver and intrahepatic bile duct: Secondary | ICD-10-CM

## 2020-10-10 DIAGNOSIS — M9902 Segmental and somatic dysfunction of thoracic region: Secondary | ICD-10-CM

## 2020-10-10 DIAGNOSIS — G8929 Other chronic pain: Secondary | ICD-10-CM

## 2020-10-10 DIAGNOSIS — M25562 Pain in left knee: Secondary | ICD-10-CM

## 2020-10-10 DIAGNOSIS — C189 Malignant neoplasm of colon, unspecified: Secondary | ICD-10-CM

## 2020-10-10 NOTE — Assessment & Plan Note (Signed)
Stable at the moment.  Patient has good range of motion.  Negative McMurray's.  Mild lateral tracking of the patella noted.  Discussed with patient that we could consider the possibility of injection.  Patient declined feeling like she is making progress and will continue with conservative therapy.

## 2020-10-10 NOTE — Patient Instructions (Addendum)
Good to see you I think we have the hip in place Try to pull left knee to left shoulder when laying down to get that area Keep up the nutrition See me again in 6-8 weeks

## 2020-10-10 NOTE — Assessment & Plan Note (Signed)
Following up with other physicians.

## 2020-10-10 NOTE — Assessment & Plan Note (Signed)
Back pain with left hip pain.  Patient is seen multiple different doctors.  We are having her responded fairly well to osteopathic manipulation.  Hopefully this will continue to make some changes.  I do believe that this is more multifactorial and glad that this is helping though we have discussed that this is not the only thing that patient needs.  Continue to work on core strength, home exercises.  To keep [place] as well as her other physicians aware for any type of changes in her health.  Patient was having some sternal pain again and we discussed further work-up including the CT scan but she is due for her In June.  Patient did have an abnormality noted on the manubrium that should be further evaluated at that time.  Patient will follow up with me again in 6 to 8 weeks

## 2020-10-30 ENCOUNTER — Ambulatory Visit: Payer: Medicare Other | Admitting: Family Medicine

## 2020-11-06 NOTE — Progress Notes (Signed)
[place] [address] [place] Phone: [phone] Subjective:   I Bianca [name] am serving as a Education administrator for Dr. Hulan Saas.  This visit occurred during the SARS-CoV-2 public health emergency.  Safety protocols were in place, including screening questions prior to the visit, additional usage of staff PPE, and extensive cleaning of exam room while observing appropriate contact time as indicated for disinfecting solutions.   I'm seeing this patient by the request  of:  Teodoro Kil, PA-C  CC: Chest pain and back pain follow-up  [id number]  10/10/2020 Stable at the moment.  Patient has good range of motion.  Negative McMurray's.  Mild lateral tracking of the patella noted.  Discussed with patient that we could consider the possibility of injection.  Patient declined feeling like she is making progress and will continue with conservative therapy.  Update 11/07/2020 Bianca [name] is a 55 y.o. female coming in with complaint of back and neck pain. OMT 10/10/2020. L knee pain. Patient states she is doing well. Knee is doing better. Left foot is "off" when she is in shoes. Chest issue is back. States it is not bad but she is stuck in a position where she can't move it.  Patient did have that abnormal CT scan that did show a potential new finding of the manubrium.  Patient is scheduled in the near future.  Medications patient has been prescribed:   Taking:         Reviewed prior external information including notes and imaging from previsou exam, outside providers and external EMR if available.  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  Patient is seen multiple providers including endocrinology, cardiology, PMNR, oncology as well as primary care.  Patient should be scheduled for another CT chest to  further evaluate the atypical appearance of the manubrium noted on CT scan March 2022 reviewing these images, notes, as well as my previous notes greater than 31 minutes today.   Past medical history, social, surgical and family history all reviewed in electronic medical record.  No pertanent information unless stated regarding to the chief complaint.   Past Medical History:  Diagnosis Date   Thyroid disease     Allergies  Allergen Reactions   Atropine Sulfate Other (See Comments)    Low BP; numbness in both arms    Beef (Bovine) Protein Anaphylaxis   Justicia Adhatoda (Malabar Nut Tree) [Justicia Adhatoda] Hives   Lac Bovis Anaphylaxis   Sulfa Antibiotics Anaphylaxis   Bactrim [Sulfamethoxazole-Trimethoprim]    Shellfish Allergy      Review of Systems:  No headache, visual changes, nausea, vomiting, diarrhea, constipation, dizziness, a, skin rash, fevers, chills, night sweats, weight loss, swollen lymph nodes, joint swelling, chest pain, shortness of breath, mood changes. POSITIVE muscle aches, body aches, abdominal pain  Objective  Blood pressure 90/60, pulse 73, height 5\' 7"  (1.702 m), weight 152 lb (68.9 kg), SpO2 97 %.   General: No apparent distress alert and oriented x3 mood and affect normal, dressed appropriately.  HEENT: Pupils equal, extraocular movements intact  Respiratory: Patient's speak in full sentences and does not appear short of breath  Cardiovascular: No lower extremity edema, non tender, no erythema  Low back exam does have loss of lordosis.  Tightness in the parascapular region as well right greater than left.  Tenderness over the left sacroiliac joint. Patient continues to have chest  discomfort noted to midline palpation.  Patient states that when pushed on the manubrium does have pain in the thoracic area as well.  Patient has good range of motion though of the shoulders noted.  Good range of motion also noted of the neck.  Negative Spurling's.  Osteopathic  findings  T3 extended rotated and side bent right inhaled rib T7 extended rotated and side bent left L2 flexed rotated and side bent right Sacrum right on right Posterior left ilium      Assessment and Plan:  No problem-specific Assessment & Plan notes found for this encounter.   Nonallopathic problems  Decision today to treat with OMT was based on Physical Exam  After verbal consent patient was treated with HVLA, ME, FPR techniques in  rib, thoracic, lumbar, and sacral and pelvis areas  Patient tolerated the procedure well with improvement in symptoms  Patient given exercises, stretches and lifestyle modifications  See medications in patient instructions if given  Patient will follow up in 4-8 weeks      The above documentation has been reviewed and is accurate and complete Lyndal Pulley, DO       Note: This dictation was prepared with Dragon dictation along with smaller phrase technology. Any transcriptional errors that result from this process are unintentional.

## 2020-11-07 ENCOUNTER — Other Ambulatory Visit: Payer: Self-pay

## 2020-11-07 ENCOUNTER — Ambulatory Visit (INDEPENDENT_AMBULATORY_CARE_PROVIDER_SITE_OTHER): Payer: Medicare Other | Admitting: Family Medicine

## 2020-11-07 ENCOUNTER — Encounter: Payer: Self-pay | Admitting: Family Medicine

## 2020-11-07 VITALS — BP 90/60 | HR 73 | Ht 67.0 in | Wt 152.0 lb

## 2020-11-07 DIAGNOSIS — M9903 Segmental and somatic dysfunction of lumbar region: Secondary | ICD-10-CM

## 2020-11-07 DIAGNOSIS — M9902 Segmental and somatic dysfunction of thoracic region: Secondary | ICD-10-CM | POA: Diagnosis not present

## 2020-11-07 DIAGNOSIS — C787 Secondary malignant neoplasm of liver and intrahepatic bile duct: Secondary | ICD-10-CM

## 2020-11-07 DIAGNOSIS — M9904 Segmental and somatic dysfunction of sacral region: Secondary | ICD-10-CM

## 2020-11-07 DIAGNOSIS — M545 Low back pain, unspecified: Secondary | ICD-10-CM

## 2020-11-07 DIAGNOSIS — C189 Malignant neoplasm of colon, unspecified: Secondary | ICD-10-CM

## 2020-11-07 DIAGNOSIS — G8929 Other chronic pain: Secondary | ICD-10-CM

## 2020-11-07 NOTE — Assessment & Plan Note (Addendum)
Patient is still following up with her other providers.  Next CT scan is in 1 month.

## 2020-11-07 NOTE — Assessment & Plan Note (Signed)
Continues to have a significant back pain.  Since actually has had some mild exacerbation recently.  Still concerned secondary to the discomfort over the manubrium and patient having the abnormal finding.  Likely patient is having a CT scan and MRI of the thoracic spine within the next month.  I do feel that this could be potentially beneficial.  Depending on findings we can continue with me with the osteopathic manipulation as patient is making some progress in terms of long-term improvement we will continue to monitor.

## 2020-11-07 NOTE — Patient Instructions (Addendum)
Good to see you Lets see what mri and CT shoes Any chest pain or shortness of breath seek medical attention Start the inhaler See me again in 6 weeks

## 2020-12-06 ENCOUNTER — Encounter: Payer: Self-pay | Admitting: Family Medicine

## 2020-12-11 NOTE — Progress Notes (Signed)
[place] Phone: [phone] Subjective:   [name], am serving as a scribe for Dr. Hulan Saas.  This visit occurred during the SARS-CoV-2 public health emergency.  Safety protocols were in place, including screening questions prior to the visit, additional usage of staff PPE, and extensive cleaning of exam room while observing appropriate contact time as indicated for disinfecting solutions.   I'm seeing this patient by the request  of:  Teodoro Kil, PA-C  CC: Back pain follow-up  [id number]  Bianca [name] is a 55 y.o. female coming in with complaint of back and neck pain. OMT 11/07/2020. Patient states that she is having more issues with thoracic spine around bra line.  Patient was happy at the moment.  Patient feels like she can start now planning for the future with patient having more healthy scans recently.  Patient states that when she does have the manipulation she has no pain for 2 to 3 days.  Then slowly seems to come back or if she moves has a pop and Discomfort.  L hip and knee feel off from twisted pelvis.   Medications patient has been prescribed: None  Taking:   Reviewed patient's outside imaging.  Patient's most recent MRIs as well as CT scan did not show any type of metastatic cancer.  Patient's abnormality noted over the sternum is completely resolved.   Past Medical History:  Diagnosis Date   Thyroid disease     Allergies  Allergen Reactions   Atropine Sulfate Other (See Comments)    Low BP; numbness in both arms    Beef (Bovine) Protein Anaphylaxis   Justicia Adhatoda (Malabar Nut Tree) [Justicia Adhatoda] Hives   Lac Bovis Anaphylaxis   Sulfa Antibiotics Anaphylaxis   Bactrim [Sulfamethoxazole-Trimethoprim]    Shellfish Allergy      Review of Systems:  No headache, visual changes, nausea, vomiting, diarrhea, constipation, dizziness, skin rash, fevers, chills,  night sweats, weight loss, swollen lymph nodes, joint swelling, chest pain, shortness of breath, mood changes. POSITIVE muscle aches, body aches  Objective  Blood pressure 110/60, pulse 82, height '5\' 7"'$  (1.702 m), weight 155 lb (70.3 kg), SpO2 97 %.   General: No apparent distress alert and oriented x3 mood and affect normal, dressed appropriately.  HEENT: Pupils equal, extraocular movements intact  Respiratory: Patient's speak in full sentences and does not appear short of breath  Cardiovascular: No lower extremity edema, non tender, no erythema  Patient does have tenderness noted in the parascapular region.  Patient has it more on the upper side of the right and the lower side on the left.  Mild tightness noted in the thoracolumbar juncture as well.  Osteopathic findings  T4 extended rotated and side bent right T9 extended rotated and side bent left L2 flexed rotated and side bent right Sacrum right on right       Assessment and Plan:  Back pain Patient does still have the thoracic pain.  Still seems to be more of the scapula.  Patient encouraged to continue the exercises.  Has been given the muscle relaxers previously.  Patient is making great strides and does seem to be in a better space physically and emotionally.  Patient is encouraged to continue to be active.  Follow-up with me again in 5 to 6 weeks.  Diaphragmatic disorder Still believe that there is a possibility for some diaphragmatic irritation that could be potentially contributing.  We will continue to  monitor.   Nonallopathic problems  Decision today to treat with OMT was based on Physical Exam  After verbal consent patient was treated with HVLA, ME, FPR techniques in cervical, rib, thoracic, lumbar, and sacral  areas  Patient tolerated the procedure well with improvement in symptoms  Patient given exercises, stretches and lifestyle modifications  See medications in patient instructions if given  Patient will  follow up in 4-8 weeks      The above documentation has been reviewed and is accurate and complete Lyndal Pulley, DO       Note: This dictation was prepared with Dragon dictation along with smaller phrase technology. Any transcriptional errors that result from this process are unintentional.

## 2020-12-12 ENCOUNTER — Ambulatory Visit (INDEPENDENT_AMBULATORY_CARE_PROVIDER_SITE_OTHER): Payer: Medicare Other | Admitting: Family Medicine

## 2020-12-12 ENCOUNTER — Encounter: Payer: Self-pay | Admitting: Family Medicine

## 2020-12-12 ENCOUNTER — Other Ambulatory Visit: Payer: Self-pay

## 2020-12-12 VITALS — BP 110/60 | HR 82 | Ht 67.0 in | Wt 155.0 lb

## 2020-12-12 DIAGNOSIS — M9902 Segmental and somatic dysfunction of thoracic region: Secondary | ICD-10-CM

## 2020-12-12 DIAGNOSIS — J986 Disorders of diaphragm: Secondary | ICD-10-CM

## 2020-12-12 DIAGNOSIS — M9903 Segmental and somatic dysfunction of lumbar region: Secondary | ICD-10-CM

## 2020-12-12 DIAGNOSIS — M9904 Segmental and somatic dysfunction of sacral region: Secondary | ICD-10-CM

## 2020-12-12 DIAGNOSIS — M545 Low back pain, unspecified: Secondary | ICD-10-CM

## 2020-12-12 DIAGNOSIS — G8929 Other chronic pain: Secondary | ICD-10-CM

## 2020-12-12 NOTE — Patient Instructions (Signed)
Happy you are doing better Thanks for sharing flowers See me in 5 weeks (30 min)

## 2020-12-12 NOTE — Assessment & Plan Note (Signed)
Patient does still have the thoracic pain.  Still seems to be more of the scapula.  Patient encouraged to continue the exercises.  Has been given the muscle relaxers previously.  Patient is making great strides and does seem to be in a better space physically and emotionally.  Patient is encouraged to continue to be active.  Follow-up with me again in 5 to 6 weeks.

## 2020-12-12 NOTE — Assessment & Plan Note (Signed)
Still believe that there is a possibility for some diaphragmatic irritation that could be potentially contributing.  We will continue to monitor.

## 2021-01-15 NOTE — Progress Notes (Signed)
Bianca [name] [place] [address] [place] Phone: [phone] Subjective:   [name], am serving as a scribe for Dr. Hulan Saas. This visit occurred during the SARS-CoV-2 public health emergency.  Safety protocols were in place, including screening questions prior to the visit, additional usage of staff PPE, and extensive cleaning of exam room while observing appropriate contact time as indicated for disinfecting solutions.   I'm seeing this patient by the request  of:  Teodoro Kil, PA-C  CC: Back and neck pain  [id number]  Bianca [name] is a 55 y.o. female coming in with complaint of back and neck pain. OMT on 12/12/2020. Patient states her neck and back pain hasn't gotten worse, but it hasn't gotten better. She feels a new tearing sensation on her right rib cage.  Patient does states that overall still significantly better.  Feels like would be manipulation has been significantly helpful.  Patient's denies any worsening with any of the abdominal pain.  Medications patient has been prescribed: None  Taking:          Past Medical History:  Diagnosis Date   Thyroid disease     Allergies  Allergen Reactions   Atropine Sulfate Other (See Comments)    Low BP; numbness in both arms    Beef (Bovine) Protein Anaphylaxis   Justicia Adhatoda (Malabar Nut Tree) [Justicia Adhatoda] Hives   Lac Bovis Anaphylaxis   Sulfa Antibiotics Anaphylaxis   Bactrim [Sulfamethoxazole-Trimethoprim]    Shellfish Allergy      Review of Systems:  No headache, visual changes, nausea, vomiting, diarrhea, constipation, dizziness, , skin rash, fevers, chills, night sweats, weight loss, swollen lymph nodes, body aches, joint swelling, chest pain, mood changes. POSITIVE muscle aches, abdominal pain, very intermittent shortness of breath secondary to more of the rib cage.  She states  Objective  Blood pressure 110/60, pulse 76, height '5\' 7"'$  (1.702  m), weight 157 lb (71.2 kg), SpO2 97 %.   General: No apparent distress alert and oriented x3 mood and affect normal, dressed appropriately.  HEENT: Pupils equal, extraocular movements intact  Respiratory: Patient's speak in full sentences and does not appear short of breath  Cardiovascular: No lower extremity edema, non tender, no erythema  Right side of the rib cage still tender to palpation.  Patient parascapular region right greater than left flank does have some mild dyskinesis.  Low back does have tenderness noted in the paraspinal musculature and pain thoracolumbar juncture.  Osteopathic findings  T8 extended rotated and side bent left L2 flexed rotated and side bent right Sacrum right on right       Assessment and Plan:  Back pain Back pain noted but have made improvement for sure. Increase strengthening  Discussed HEP  Discussed monitoring for symptom but overall doing well  RTC in 4-6 weeks Total time with patient though today discussing all her ailments as well as the great strides that she has made so far 32 minutes.   Nonallopathic problems  Decision today to treat with OMT was based on Physical Exam  After verbal consent patient was treated with HVLA, ME, FPR techniques in  thoracic, lumbar, and sacral  areas  Patient tolerated the procedure well with improvement in symptoms  Patient given exercises, stretches and lifestyle modifications  See medications in patient instructions if given  Patient will follow up in 4-8 weeks      The above documentation has been reviewed and is accurate and complete [name]  [name], DO        Note: This dictation was prepared with Dragon dictation along with smaller phrase technology. Any transcriptional errors that result from this process are unintentional.

## 2021-01-16 ENCOUNTER — Other Ambulatory Visit: Payer: Self-pay

## 2021-01-16 ENCOUNTER — Ambulatory Visit (INDEPENDENT_AMBULATORY_CARE_PROVIDER_SITE_OTHER): Payer: Medicare Other | Admitting: Family Medicine

## 2021-01-16 VITALS — BP 110/60 | HR 76 | Ht 67.0 in | Wt 157.0 lb

## 2021-01-16 DIAGNOSIS — M545 Low back pain, unspecified: Secondary | ICD-10-CM | POA: Diagnosis not present

## 2021-01-16 DIAGNOSIS — M9904 Segmental and somatic dysfunction of sacral region: Secondary | ICD-10-CM | POA: Diagnosis not present

## 2021-01-16 DIAGNOSIS — M9903 Segmental and somatic dysfunction of lumbar region: Secondary | ICD-10-CM

## 2021-01-16 DIAGNOSIS — G8929 Other chronic pain: Secondary | ICD-10-CM

## 2021-01-16 DIAGNOSIS — M9902 Segmental and somatic dysfunction of thoracic region: Secondary | ICD-10-CM | POA: Diagnosis not present

## 2021-01-16 NOTE — Patient Instructions (Signed)
Keep doing what you're doing!! Keep working on your posture See you again in 4-6 weeks

## 2021-01-17 ENCOUNTER — Encounter: Payer: Self-pay | Admitting: Family Medicine

## 2021-01-17 NOTE — Assessment & Plan Note (Signed)
Back pain noted but have made improvement for sure. Increase strengthening  Discussed HEP  Discussed monitoring for symptom but overall doing well  RTC in 4-6 weeks Total time with patient though today discussing all her ailments as well as the great strides that she has made so far 32 minutes.

## 2021-02-12 NOTE — Progress Notes (Signed)
[place] [address] [place] Phone: [phone] Subjective:    I'm seeing this patient by the request  of:  Teodoro Kil, PA-C  CC: Neck and back pain  [id number]  Bianca [name] is a 55 y.o. female coming in with complaint of back and neck pain. OMT on 01/16/2021. Patient states pain remains the same. Believes she has "tweaked" her left knee and hip, in addition to the right side of her neck.  Patient states that she continues to have a very tightness.  Unable to only burp and other difficulties with the cage.  Patient still feels a bit overall making some progress though.  Medications patient has been prescribed: None           Past Medical History:  Diagnosis Date   Thyroid disease     Allergies  Allergen Reactions   Atropine Sulfate Other (See Comments)    Low BP; numbness in both arms    Beef (Bovine) Protein Anaphylaxis   Justicia Adhatoda (Malabar Nut Tree) [Justicia Adhatoda] Hives   Lac Bovis Anaphylaxis   Sulfa Antibiotics Anaphylaxis   Bactrim [Sulfamethoxazole-Trimethoprim]    Shellfish Allergy      Review of Systems:  No headache, visual changes, nausea, vomiting, diarrhea, constipation, dizziness, , skin rash, fevers, chills, night sweats, weight loss, swollen lymph nodes, body aches, joint swelling, chest pain, shortness of breath, mood changes. POSITIVE muscle aches, abdominal pain  Objective  Blood pressure (!) 98/56, pulse 76, weight 159 lb (72.1 kg), SpO2 99 %.   General: No apparent distress alert and oriented x3 mood and affect normal, dressed appropriately.  HEENT: Pupils equal, extraocular movements intact  Respiratory: Patient's speak in full sentences and does not appear short of breath  Cardiovascular: No lower extremity edema, non tender, no erythema  Patient still has fullness noted of the abdominal area.  Patient still tender diffusely to palpation but very minorly. Significant  tightness noted in the thoracic area.  No midline tenderness but does have pain just to the right and left of midline at the T9-T10 area.  Osteopathic findings   T8 extended rotated and side bent left L2 flexed rotated and side bent right Sacrum right on right       Assessment and Plan:  Diaphragmatic disorder Still concern for more of the difficulty with the diaphragm causing some of the discomfort as well.  Patient has improved range of motion of the arm but continues to have intermittent pain.  Has been responding relatively well though to her home exercises.  Patient is continuing to follow-up with her other providers and encouraged her to do so on a regular basis.  Do not feel any other significant change in medications would make significant improvement either.  Patient does feel that the intervals of 4 to 6 weeks have been more beneficial.  Will continue at this time    Nonallopathic problems  Decision today to treat with OMT was based on Physical Exam  After verbal consent patient was treated with HVLA, ME, FPR techniques in  thoracic, lumbar, and sacral  areas  Patient tolerated the procedure well with improvement in symptoms  Patient given exercises, stretches and lifestyle modifications  See medications in patient instructions if given  Patient will follow up in 4-8 weeks      The above documentation has been reviewed and is accurate and complete Bianca Pulley, DO        Note: This dictation was  prepared with Dragon dictation along with smaller phrase technology. Any transcriptional errors that result from this process are unintentional.

## 2021-02-13 ENCOUNTER — Other Ambulatory Visit: Payer: Self-pay

## 2021-02-13 ENCOUNTER — Ambulatory Visit (INDEPENDENT_AMBULATORY_CARE_PROVIDER_SITE_OTHER): Payer: Medicare Other | Admitting: Family Medicine

## 2021-02-13 DIAGNOSIS — J986 Disorders of diaphragm: Secondary | ICD-10-CM

## 2021-02-13 NOTE — Patient Instructions (Addendum)
Now Happy Yoga Good to see you! Making progress little by little See you again in 4 weeks

## 2021-02-13 NOTE — Assessment & Plan Note (Addendum)
Still concern for more of the difficulty with the diaphragm causing some of the discomfort as well.  Patient has improved range of motion of the arm but continues to have intermittent pain.  Has been responding relatively well though to her home exercises.  Patient is continuing to follow-up with her other providers and encouraged her to do so on a regular basis.  Do not feel any other significant change in medications would make significant improvement either.  Patient does feel that the intervals of 4 to 6 weeks have been more beneficial.  Will continue at this time total time with patient and reviewsing charts 34 minutes

## 2021-03-13 ENCOUNTER — Ambulatory Visit (INDEPENDENT_AMBULATORY_CARE_PROVIDER_SITE_OTHER): Payer: Medicare Other | Admitting: Sports Medicine

## 2021-03-13 ENCOUNTER — Other Ambulatory Visit: Payer: Self-pay

## 2021-03-13 VITALS — BP 110/68 | HR 73 | Ht 67.0 in | Wt 157.0 lb

## 2021-03-13 DIAGNOSIS — M9903 Segmental and somatic dysfunction of lumbar region: Secondary | ICD-10-CM | POA: Diagnosis not present

## 2021-03-13 DIAGNOSIS — M9908 Segmental and somatic dysfunction of rib cage: Secondary | ICD-10-CM

## 2021-03-13 DIAGNOSIS — M9905 Segmental and somatic dysfunction of pelvic region: Secondary | ICD-10-CM

## 2021-03-13 DIAGNOSIS — M9902 Segmental and somatic dysfunction of thoracic region: Secondary | ICD-10-CM

## 2021-03-13 DIAGNOSIS — M9901 Segmental and somatic dysfunction of cervical region: Secondary | ICD-10-CM

## 2021-03-13 DIAGNOSIS — M546 Pain in thoracic spine: Secondary | ICD-10-CM

## 2021-03-13 NOTE — Progress Notes (Signed)
Bianca [name] Bianca [name] [place] Phone: [phone]   Assessment and Plan:     1. Thoracic spine pain 2. Somatic dysfunction of cervical region 3. Somatic dysfunction of thoracic region 4. Somatic dysfunction of lumbar region 5. Somatic dysfunction of pelvic region 6. Somatic dysfunction of rib region -Chronic with exacerbation, subsequent sports medicine visit - Recurrent thoracic spine pain with history of multiple musculoskeletal pains that are moderately improved with regular OMT treatment.  Patient elects for repeat OMT today tolerated well per note below   Decision today to treat with OMT was based on Physical Exam   After verbal consent patient was treated with HVLA (high velocity low amplitude), ME (muscle energy), FPR (flex positional release), ST (soft tissue), PC/PD (Pelvic Compression/ Pelvic Decompression) techniques in cervical, rib, thoracic, lumbar, and pelvic areas. Patient tolerated the procedure well with improvement in symptoms.  Patient educated on potential side effects of soreness and recommended to rest, hydrate, and use Tylenol as needed for pain control.   Patient also states that she twisted L knee recently and that Dr. Tamala Julian typically manipulates her knee back into alignment.    Pertinent previous records reviewed include discussion with Dr. Tamala Julian, reviewed Dr. Thompson Caul notes   Follow Up: In 4 weeks for repeat OMT   Subjective:   I, Bianca [name], am serving as a scribe for Dr. Benito [name].    This visit occurred during the SARS-CoV-2 public health emergency.  Safety protocols were in place, including screening questions prior to the visit, additional usage of staff PPE, and extensive cleaning of exam room while observing appropriate contact time as indicated for disinfecting solutions.   Chief Complaint: Neck and back pain   HPI:   03/13/21 Patient is a 55 year old female presenting with neck and  back pain. Patient last saw Dr. Tamala Julian on 02/13/21 and had OMT. Today patient states that when she has L anterior rib malalignment she will have diaphragm issues as well as tingling in fingers and toes. Patient traveled to beach and did well due to using a stretching pillow. Feels out of alignment due to pulling up honeysuckle recently. Does feel like we are moving in right direction.   Relevant Historical Information: History of stage IV liver cancer currently 2 years of remission  Additional pertinent review of systems negative.  Current Outpatient Medications  Medication Sig Dispense Refill   albuterol (VENTOLIN HFA) 108 (90 Base) MCG/ACT inhaler Inhale 2 puffs into the lungs every 4 (four) hours as needed for wheezing or shortness of breath. 1 each 6   amoxicillin-clavulanate (AUGMENTIN) 875-125 MG per tablet Take 1 tablet by mouth 2 (two) times daily. Take with food 10 tablet 0   beclomethasone (QVAR REDIHALER) 40 MCG/ACT inhaler Inhale 1 puff into the lungs 2 (two) times daily. 1 each 0   benzonatate (TESSALON) 200 MG capsule Take 1 capsule (200 mg total) by mouth at bedtime. 12 capsule 0   levothyroxine (SYNTHROID, LEVOTHROID) 25 MCG tablet Take 25 mcg by mouth daily.     loperamide (IMODIUM A-D) 2 MG tablet      midodrine (PROAMATINE) 2.5 MG tablet Take by mouth at bedtime.     tiZANidine (ZANAFLEX) 2 MG tablet Take 1 tablet (2 mg total) by mouth at bedtime. 30 tablet 0   No current facility-administered medications for this visit.      Objective:     Vitals:   03/13/21 1448  BP: 110/68  Pulse: 73  SpO2: 99%  Weight: 157 lb (71.2 kg)  Height: 5\' 7"  (1.702 m)      Body mass index is 24.59 kg/m.    Physical Exam:     General: Well-appearing, cooperative, sitting comfortably in no acute distress.   OMT Physical Exam:  ASIS Compression Test: Positive Right Cervical: TTP paraspinal, C3 RRSL, C5 RLSR Rib: Inhalation dysfunction ribs 3 through 5 on left Thoracic: TTP  paraspinal, T4-8 RLSR Lumbar: TTP paraspinal, L1-3 RRSL Pelvis: Right anterior innominate without flare  Electronically signed by:  Bianca [name] D.[name] 3:27 PM 03/13/21

## 2021-04-08 ENCOUNTER — Ambulatory Visit: Payer: Medicare Other | Admitting: Family Medicine

## 2021-04-08 ENCOUNTER — Encounter: Payer: Self-pay | Admitting: Sports Medicine

## 2021-04-08 ENCOUNTER — Ambulatory Visit (INDEPENDENT_AMBULATORY_CARE_PROVIDER_SITE_OTHER): Payer: Medicare Other | Admitting: Sports Medicine

## 2021-04-08 ENCOUNTER — Other Ambulatory Visit: Payer: Self-pay

## 2021-04-08 VITALS — BP 90/60 | HR 76 | Ht 67.0 in | Wt 158.0 lb

## 2021-04-08 DIAGNOSIS — M9905 Segmental and somatic dysfunction of pelvic region: Secondary | ICD-10-CM

## 2021-04-08 DIAGNOSIS — M9901 Segmental and somatic dysfunction of cervical region: Secondary | ICD-10-CM

## 2021-04-08 DIAGNOSIS — M9903 Segmental and somatic dysfunction of lumbar region: Secondary | ICD-10-CM

## 2021-04-08 DIAGNOSIS — M546 Pain in thoracic spine: Secondary | ICD-10-CM | POA: Diagnosis not present

## 2021-04-08 DIAGNOSIS — M9902 Segmental and somatic dysfunction of thoracic region: Secondary | ICD-10-CM | POA: Diagnosis not present

## 2021-04-08 DIAGNOSIS — M9908 Segmental and somatic dysfunction of rib cage: Secondary | ICD-10-CM

## 2021-04-08 NOTE — Patient Instructions (Addendum)
Good to see you  Follow up in 4 weeks for repeat OMT

## 2021-04-08 NOTE — Progress Notes (Signed)
Bianca [name] D.[place] Phone: [phone]   Assessment and Plan:     1. Thoracic spine pain 2. Somatic dysfunction of cervical region 3. Somatic dysfunction of thoracic region 4. Somatic dysfunction of lumbar region 5. Somatic dysfunction of pelvic region 6. Somatic dysfunction of rib region -Chronic with exacerbation, subsequent visit - Recurrence of typical musculoskeletal complaints with most significant being in mid thoracic region - Patient has received significant relief with OMT in the past.  Elects for repeat OMT today.  Tolerated well per note below. - Decision today to treat with OMT was based on Physical Exam   After verbal consent patient was treated with HVLA (high velocity low amplitude), ME (muscle energy), FPR (flex positional release), ST (soft tissue), PC/PD (Pelvic Compression/ Pelvic Decompression) techniques in cervical, rib, thoracic, lumbar, and pelvic areas. Patient tolerated the procedure well with improvement in symptoms.  Patient educated on potential side effects of soreness and recommended to rest, hydrate, and use Tylenol as needed for pain control.   Pertinent previous records reviewed include none   Follow Up: 4 weeks for repeat OMT.  Patient symptoms tend to return at 3 to 4 weeks, so she requests in general to have 4-week follow-ups   Subjective:   I, Bianca [name], am serving as a scribe for Dr. Glennon Mac  Chief Complaint: Neck and back pain   HPI:   03/13/21 Patient is a 56 year old female presenting with neck and back pain. Patient last saw Dr. Tamala Julian on 02/13/21 and had OMT. Today patient states that when she has L anterior rib malalignment she will have diaphragm issues as well as tingling in fingers and toes. Patient traveled to beach and did well due to using a stretching pillow. Feels out of alignment due to pulling up honeysuckle recently. Does feel like we are moving in  right direction.   04/08/21 Patient states patient states that she thinks the monthly plan of OMT is helping a lot to keep her back loose. States when that month is coming up she can feel the tension and she has a hard time loosening it her self, but has had moment where she was able to manipulate herself to help with the pain.   Relevant Historical Information: History of stage IV liver cancer currently 2 years of remission  Additional pertinent review of systems negative.  Current Outpatient Medications  Medication Sig Dispense Refill   albuterol (VENTOLIN HFA) 108 (90 Base) MCG/ACT inhaler Inhale 2 puffs into the lungs every 4 (four) hours as needed for wheezing or shortness of breath. 1 each 6   amoxicillin-clavulanate (AUGMENTIN) 875-125 MG per tablet Take 1 tablet by mouth 2 (two) times daily. Take with food 10 tablet 0   beclomethasone (QVAR REDIHALER) 40 MCG/ACT inhaler Inhale 1 puff into the lungs 2 (two) times daily. 1 each 0   benzonatate (TESSALON) 200 MG capsule Take 1 capsule (200 mg total) by mouth at bedtime. 12 capsule 0   levothyroxine (SYNTHROID, LEVOTHROID) 25 MCG tablet Take 25 mcg by mouth daily.     loperamide (IMODIUM A-D) 2 MG tablet      midodrine (PROAMATINE) 2.5 MG tablet Take by mouth at bedtime.     tiZANidine (ZANAFLEX) 2 MG tablet Take 1 tablet (2 mg total) by mouth at bedtime. 30 tablet 0   No current facility-administered medications for this visit.      Objective:     Vitals:  04/08/21 1459  BP: 90/60  Pulse: 76  SpO2: 97%  Weight: 158 lb (71.7 kg)  Height: 5\' 7"  (1.702 m)      Body mass index is 24.75 kg/m.    Physical Exam:     General: Well-appearing, cooperative, sitting comfortably in no acute distress.   OMT Physical Exam:  ASIS Compression Test: Positive Right Cervical: TTP paraspinal, C3 RRSL, C5 RLSR Rib: Inhalation dysfunction ribs 3 through 5 on left Thoracic: TTP paraspinal, T4-8 RLSR Lumbar: TTP paraspinal, L1-3  RRSL Pelvis: Right anterior innominate without flare  Electronically signed by:  Bianca [name] D.[name] 3:28 PM 04/08/21

## 2021-05-07 NOTE — Progress Notes (Signed)
[place] [address] [place] Phone: [phone] Subjective:    I'm seeing this patient by the request  of:  Teodoro Kil, PA-C  CC: Back and neck pain  [id number]  Bianca [name] is a 55 y.o. female coming in with complaint of back and neck pain. OMT with Dr. Glennon Mac on 04/08/2021. Patient states. No change. No other complaints.  Reviewed patient's notes from other providers.  Also reviewed patient's most recent MRI of the abdomen that did not show any significant abnormality noted.  No signs of recurrence of the cancer.  Medications patient has been prescribed: None           Reviewed prior external information including notes and imaging from previsou exam, outside providers and external EMR if available.   As well as notes that were available from care everywhere and other healthcare systems.  Past medical history, social, surgical and family history all reviewed in electronic medical record.  No pertanent information unless stated regarding to the chief complaint.   Past Medical History:  Diagnosis Date   Thyroid disease     Allergies  Allergen Reactions   Atropine Sulfate Other (See Comments)    Low BP; numbness in both arms    Beef (Bovine) Protein Anaphylaxis   Justicia Adhatoda (Malabar Nut Tree) [Justicia Adhatoda] Hives   Lac Bovis Anaphylaxis   Sulfa Antibiotics Anaphylaxis   Bactrim [Sulfamethoxazole-Trimethoprim]    Shellfish Allergy      Review of Systems:  No headache, visual changes, nausea, vomiting, diarrhea, constipation, dizziness, skin rash, fevers, chills, night sweats, weight loss, swollen lymph nodes,  joint swelling, chest pain, shortness of breath, mood changes. POSITIVE muscle aches, body aches, abdominal pain  Objective  Blood pressure 92/64, pulse 84, height 5\' 7"  (1.702 m), weight 159 lb (72.1 kg), SpO2 97 %.   General: No apparent distress alert and oriented x3 mood and  affect normal, dressed appropriately.  HEENT: Pupils equal, extraocular movements intact  Respiratory: Patient's speak in full sentences and does not appear short of breath  Cardiovascular: No lower extremity edema, non tender, no erythema  Back exam does still have tightness noted more in the thoracic ar still some mild abdominal discomfort noted on exam but no masses appreciated.    Patient does have some limited sidebending bilaterally.  No midline tenderness.  Osteopathic findings C3 flexed rotated and side bent left T3 extended rotated and side bent right inhaled rib T9 extended rotated and side bent left L2 flexed rotated and side bent right Sacrum right on right Pelvic shear noted right      Assessment and Plan:  Back pain Patient is making strides.  Responding extremely well to osteopathic manipulation at this time.  Discussed icing regimen and home exercises, discussed which activities to do and which ones to avoid.  Increase activity slowly.  Follow-up with me again in 4 to 6 weeks.  Total time reviewing patient's outside imaging, previous notes and discussing with patient greater than 32 minutes  Nonallopathic problems  Decision today to treat with OMT was based on Physical Exam  After verbal consent patient was treated with HVLA, ME, FPR techniques in cervical, rib, thoracic, lumbar, and sacral and pelvis areas  Patient tolerated the procedure well with improvement in symptoms  Patient given exercises, stretches and lifestyle modifications  See medications in patient instructions if given  Patient will follow up in 4-8 weeks     The above documentation has been  reviewed and is accurate and complete Lyndal Pulley, DO        Note: This dictation was prepared with Dragon dictation along with smaller phrase technology. Any transcriptional errors that result from this process are unintentional.

## 2021-05-08 ENCOUNTER — Ambulatory Visit (INDEPENDENT_AMBULATORY_CARE_PROVIDER_SITE_OTHER): Payer: Medicare Other | Admitting: Family Medicine

## 2021-05-08 ENCOUNTER — Other Ambulatory Visit: Payer: Self-pay

## 2021-05-08 VITALS — BP 92/64 | HR 84 | Ht 67.0 in | Wt 159.0 lb

## 2021-05-08 DIAGNOSIS — M9901 Segmental and somatic dysfunction of cervical region: Secondary | ICD-10-CM | POA: Diagnosis not present

## 2021-05-08 DIAGNOSIS — M9908 Segmental and somatic dysfunction of rib cage: Secondary | ICD-10-CM | POA: Diagnosis not present

## 2021-05-08 DIAGNOSIS — M9902 Segmental and somatic dysfunction of thoracic region: Secondary | ICD-10-CM | POA: Diagnosis not present

## 2021-05-08 DIAGNOSIS — M9903 Segmental and somatic dysfunction of lumbar region: Secondary | ICD-10-CM

## 2021-05-08 DIAGNOSIS — M9905 Segmental and somatic dysfunction of pelvic region: Secondary | ICD-10-CM | POA: Diagnosis not present

## 2021-05-08 DIAGNOSIS — M9904 Segmental and somatic dysfunction of sacral region: Secondary | ICD-10-CM

## 2021-05-08 DIAGNOSIS — M545 Low back pain, unspecified: Secondary | ICD-10-CM

## 2021-05-08 DIAGNOSIS — G8929 Other chronic pain: Secondary | ICD-10-CM

## 2021-05-08 NOTE — Assessment & Plan Note (Signed)
Patient is making strides.  Responding extremely well to osteopathic manipulation at this time.  Discussed icing regimen and home exercises, discussed which activities to do and which ones to avoid.  Increase activity slowly.  Follow-up with me again in 4 to 6 weeks.

## 2021-06-04 NOTE — Progress Notes (Signed)
[place] Phone: [phone] Subjective:   [name], am serving as a scribe for Dr. Hulan Saas.This visit occurred during the SARS-CoV-2 public health emergency.  Safety protocols were in place, including screening questions prior to the visit, additional usage of staff PPE, and extensive cleaning of exam room while observing appropriate contact time as indicated for disinfecting solutions.  I'm seeing this patient by the request  of:  Teodoro Kil, PA-C  CC: Back and neck pain follow-up  [id number]  Bianca [name] is a 56 y.o. female coming in with complaint of back and neck pain. OMT on 05/08/2021.  Patient continues to be followed by her oncology, endocrinology, and family medicine providers.  Reviewed all charts since patient is seeing [place].  Patient states that she feels like she was about to faint in past 2 weeks more frequently. States that pain in scapula in L side is worse. Having cramps into ribs for past 2 weeks as well. Was doing better prior to the past 2 weeks.   Medications patient has been prescribed: None  Taking:         Reviewed prior external information including notes and imaging from previsou exam, outside providers and external EMR if available.   As well as notes that were available from care everywhere and other healthcare systems.  Past medical history, social, surgical and family history all reviewed in electronic medical record.  No pertanent information unless stated regarding to the chief complaint.   Past Medical History:  Diagnosis Date   Thyroid disease     Allergies  Allergen Reactions   Atropine Sulfate Other (See Comments)    Low BP; numbness in both arms    Beef (Bovine) Protein Anaphylaxis   Justicia Adhatoda (Malabar Nut Tree) [Justicia Adhatoda] Hives   Lac Bovis Anaphylaxis   Sulfa Antibiotics Anaphylaxis   Bactrim [Sulfamethoxazole-Trimethoprim]     Shellfish Allergy      Review of Systems:  No headache, visual changes, nausea, vomiting, diarrhea, constipation, dizziness, abdominal pain, skin rash, fevers, chills, night sweats, weight loss, swollen lymph nodes, body aches, joint swelling, chest pain, shortness of breath, mood changes. POSITIVE muscle aches  Objective  Blood pressure 100/66, pulse 75, height 5\' 7"  (1.702 m), weight 161 lb (73 kg), SpO2 98 %.   General: No apparent distress alert and oriented x3 mood and affect normal, dressed appropriately.  HEENT: Pupils equal, extraocular movements intact  Respiratory: Patient's speak in full sentences and does not appear short of breath  Cardiovascular: No lower extremity edema, non tender, no erythema   Low back does have some loss of lordosis.  Patient does have some tenderness more in the left scapular region. Patient does have tightness noted in the parascapular area in the lumbar spine on the left side today.  20 mg/2 mL of Euflexxa (sodium hyaluronate) in a prefilled syringe was injected easily into the knee through a 22-gauge needle.  Osteopathic findings  C3 flexed rotated and side bent right C6 flexed rotated and side bent left T6 extended rotated and side bent left inhaled rib L1 flexed rotated and side bent left Sacrum r left on left       Assessment and Plan:  Back pain .  Does have tightness still noted in the parascapular region right greater than left.  Patient did have some tenderness to palpation a little more in the right lower back as well.  Seem to be on  the left side as well responding well to manipulation again.  Patient does have the muscle relaxer for breakthrough pains.  Patient continues to have some signs of urinary autonomic dysfunction, low blood glucose, that gives patient some near syncopal events.  Discussed the potential continuous glucose monitor that could be beneficial as well.  Follow-up with me again for her back in 4 weeks     Nonallopathic problems  Decision today to treat with OMT was based on Physical Exam  After verbal consent patient was treated with HVLA, ME, FPR techniques in cervical, rib, thoracic, lumbar, and sacral  areas  Patient tolerated the procedure well with improvement in symptoms  Patient given exercises, stretches and lifestyle modifications  See medications in patient instructions if given  Patient will follow up in 4-8 weeks      The above documentation has been reviewed and is accurate and complete Lyndal Pulley, DO        Note: This dictation was prepared with Dragon dictation along with smaller phrase technology. Any transcriptional errors that result from this process are unintentional.

## 2021-06-05 ENCOUNTER — Other Ambulatory Visit: Payer: Self-pay

## 2021-06-05 ENCOUNTER — Encounter: Payer: Self-pay | Admitting: Family Medicine

## 2021-06-05 ENCOUNTER — Ambulatory Visit (INDEPENDENT_AMBULATORY_CARE_PROVIDER_SITE_OTHER): Payer: Medicare Other | Admitting: Family Medicine

## 2021-06-05 VITALS — BP 100/66 | HR 75 | Ht 67.0 in | Wt 161.0 lb

## 2021-06-05 DIAGNOSIS — M9902 Segmental and somatic dysfunction of thoracic region: Secondary | ICD-10-CM

## 2021-06-05 DIAGNOSIS — M9903 Segmental and somatic dysfunction of lumbar region: Secondary | ICD-10-CM

## 2021-06-05 DIAGNOSIS — M9901 Segmental and somatic dysfunction of cervical region: Secondary | ICD-10-CM

## 2021-06-05 DIAGNOSIS — M545 Low back pain, unspecified: Secondary | ICD-10-CM | POA: Diagnosis not present

## 2021-06-05 DIAGNOSIS — M9908 Segmental and somatic dysfunction of rib cage: Secondary | ICD-10-CM | POA: Diagnosis not present

## 2021-06-05 DIAGNOSIS — G8929 Other chronic pain: Secondary | ICD-10-CM

## 2021-06-05 DIAGNOSIS — M9904 Segmental and somatic dysfunction of sacral region: Secondary | ICD-10-CM | POA: Diagnosis not present

## 2021-06-05 NOTE — Patient Instructions (Signed)
Look into continuous glucose monitor Ask endocrinology Things moved well Focus on yourself See me again in 4 weeks

## 2021-06-05 NOTE — Assessment & Plan Note (Signed)
.    Does have tightness still noted in the parascapular region right greater than left.  Patient did have some tenderness to palpation a little more in the right lower back as well.  Seem to be on the left side as well responding well to manipulation again.  Patient does have the muscle relaxer for breakthrough pains.  Patient continues to have some signs of urinary autonomic dysfunction, low blood glucose, that gives patient some near syncopal events.  Discussed the potential continuous glucose monitor that could be beneficial as well.  Follow-up with me again for her back in 4 weeks

## 2021-07-02 NOTE — Progress Notes (Signed)
[place] Phone: [phone] Subjective:   Bianca [name], am serving as a scribe for Dr. Hulan [name]. This visit occurred during the SARS-CoV-2 public health emergency.  Safety protocols were in place, including screening questions prior to the visit, additional usage of staff PPE, and extensive cleaning of exam room while observing appropriate contact time as indicated for disinfecting solutions.  I'm seeing this patient by the request  of:  Bianca Kil, PA-C  CC: Multiple complaints follow-up  [id number]  Bianca [name] is a 56 y.o. female coming in with complaint of back and neck pain. OMT on 05/08/2021.  Patient has had 1 episode of enteritis since we have seen patient.  Having difficulty psychosocial stressors secondary to her comorbidities.  Patient states L ribs are painful. Feels like she has some neuropathy in feet as they are contracting in the mornings.   Medications patient has been prescribed: None  Taking:       Reviewed prior external information including notes and imaging from previsou exam, outside providers and external EMR if available.   As well as notes that were available from care everywhere and other healthcare systems.  Past medical history, social, surgical and family history all reviewed in electronic medical record.  [name] information unless stated regarding to the chief complaint.   Past Medical History:  Diagnosis Date   Thyroid disease     Allergies  Allergen Reactions   Atropine Sulfate Other (See Comments)    Low BP; numbness in both arms    Beef (Bovine) Protein Anaphylaxis   Justicia Adhatoda (Malabar Nut Tree) [Justicia Adhatoda] Hives   Lac Bovis Anaphylaxis   Sulfa Antibiotics Anaphylaxis   Bactrim [Sulfamethoxazole-Trimethoprim]    Shellfish Allergy      Review of Systems:  [name] headache, visual changes, nausea, vomiting, diarrhea,  constipation, dizziness, skin rash, fevers, chills, night sweats, weight loss, swollen lymph nodes,, chest pain, shortness of breath, mood changes. POSITIVE muscle aches, abdominal pain, body aches, joint swelling  Objective  Blood pressure 100/60, pulse 76, height 5\' 7"  (1.702 m), weight 161 lb (73 kg), SpO2 99 %.   General: [name] apparent distress alert and oriented x3 mood and affect normal, dressed appropriately.  Patient does appear to be more anxious.  Appears to be a little more rundown with energy as well HEENT: Pupils equal, extraocular movements intact  Respiratory: Patient's speak in full sentences and does not appear short of breath  Cardiovascular: [name] lower extremity edema, non tender, [name] erythema    Osteopathic findings  C2 flexed rotated and side bent right C6 flexed rotated and side bent left T3 extended rotated and side bent right inhaled rib T8 extended rotated and side bent left inhaled rib L1 flexed rotated and side bent right Sacrum right on right       Assessment and Plan:  Back pain Chronic, with exacerbation.  Continues to have significant amount of difficulties.  Patient may be even having a another UTI which patient is going to follow-up with primary care provider.  Discussed with patient though that if any significant worsening happens the patient should seek medical attention sooner.  We discussed with patient that I do think she was making progress but I do feel that anxiety could be contributing as well.  We will see patient again in 4 to 6 weeks.  Total time with patient today greater than 31 minutes    Nonallopathic problems  Decision  today to treat with OMT was based on Physical Exam  After verbal consent patient was treated with HVLA, ME, FPR techniques in cervical, rib, thoracic, lumbar, and sacral  areas  Patient tolerated the procedure well with improvement in symptoms  Patient given exercises, stretches and lifestyle modifications  See  medications in patient instructions if given  Patient will follow up in 4-8 weeks      The above documentation has been reviewed and is accurate and complete Bianca Pulley, DO        Note: This dictation was prepared with Dragon dictation along with smaller phrase technology. Any transcriptional errors that result from this process are unintentional.

## 2021-07-03 ENCOUNTER — Encounter: Payer: Self-pay | Admitting: Family Medicine

## 2021-07-03 ENCOUNTER — Other Ambulatory Visit: Payer: Self-pay

## 2021-07-03 ENCOUNTER — Ambulatory Visit (INDEPENDENT_AMBULATORY_CARE_PROVIDER_SITE_OTHER): Payer: Medicare Other | Admitting: Family Medicine

## 2021-07-03 VITALS — BP 100/60 | HR 76 | Ht 67.0 in | Wt 161.0 lb

## 2021-07-03 DIAGNOSIS — M9908 Segmental and somatic dysfunction of rib cage: Secondary | ICD-10-CM | POA: Diagnosis not present

## 2021-07-03 DIAGNOSIS — M9902 Segmental and somatic dysfunction of thoracic region: Secondary | ICD-10-CM

## 2021-07-03 DIAGNOSIS — M545 Low back pain, unspecified: Secondary | ICD-10-CM | POA: Diagnosis not present

## 2021-07-03 DIAGNOSIS — G8929 Other chronic pain: Secondary | ICD-10-CM

## 2021-07-03 DIAGNOSIS — M9904 Segmental and somatic dysfunction of sacral region: Secondary | ICD-10-CM

## 2021-07-03 DIAGNOSIS — M9901 Segmental and somatic dysfunction of cervical region: Secondary | ICD-10-CM | POA: Diagnosis not present

## 2021-07-03 DIAGNOSIS — M9903 Segmental and somatic dysfunction of lumbar region: Secondary | ICD-10-CM | POA: Diagnosis not present

## 2021-07-03 NOTE — Assessment & Plan Note (Signed)
Chronic, with exacerbation.  Continues to have significant amount of difficulties.  Patient may be even having a another UTI which patient is going to follow-up with primary care provider.  Discussed with patient though that if any significant worsening happens the patient should seek medical attention sooner.  We discussed with patient that I do think she was making progress but I do feel that anxiety could be contributing as well.  We will see patient again in 4 to 6 weeks.  Total time with patient today greater than 31 minutes

## 2021-07-03 NOTE — Patient Instructions (Signed)
Go see Primary Update me on Monday See me at your next appt

## 2021-07-11 ENCOUNTER — Encounter: Payer: Self-pay | Admitting: Family Medicine

## 2021-07-28 NOTE — Progress Notes (Unsigned)
°  [place] [address] [place] Phone: [phone] Subjective:    I'm seeing this patient by the request  of:  Teodoro Kil, PA-C  CC:   [id number]  Bianca [name] is a 56 y.o. female coming in with complaint of back and neck pain. OMT 07/03/2021. Patient states   Medications patient has been prescribed: None  Taking:         Reviewed prior external information including notes and imaging from previsou exam, outside providers and external EMR if available.   As well as notes that were available from care everywhere and other healthcare systems.  Past medical history, social, surgical and family history all reviewed in electronic medical record.  No pertanent information unless stated regarding to the chief complaint.   Past Medical History:  Diagnosis Date   Thyroid disease     Allergies  Allergen Reactions   Atropine Sulfate Other (See Comments)    Low BP; numbness in both arms    Beef (Bovine) Protein Anaphylaxis   Justicia Adhatoda (Malabar Nut Tree) [Justicia Adhatoda] Hives   Lac Bovis Anaphylaxis   Sulfa Antibiotics Anaphylaxis   Bactrim [Sulfamethoxazole-Trimethoprim]    Shellfish Allergy      Review of Systems:  No headache, visual changes, nausea, vomiting, diarrhea, constipation, dizziness, abdominal pain, skin rash, fevers, chills, night sweats, weight loss, swollen lymph nodes, body aches, joint swelling, chest pain, shortness of breath, mood changes. POSITIVE muscle aches  Objective  There were no vitals taken for this visit.   General: No apparent distress alert and oriented x3 mood and affect normal, dressed appropriately.  HEENT: Pupils equal, extraocular movements intact  Respiratory: Patient's speak in full sentences and does not appear short of breath  Cardiovascular: No lower extremity edema, non tender, no erythema  Neuro: Cranial nerves II through XII are intact, neurovascularly  intact in all extremities with 2+ DTRs and 2+ pulses.  Gait normal with good balance and coordination.  MSK:  Non tender with full range of motion and good stability and symmetric strength and tone of shoulders, elbows, wrist, hip, knee and ankles bilaterally.  Back - Normal skin, Spine with normal alignment and no deformity.  No tenderness to vertebral process palpation.  Paraspinous muscles are not tender and without spasm.   Range of motion is full at neck and lumbar sacral regions  Osteopathic findings  C2 flexed rotated and side bent right C6 flexed rotated and side bent left T3 extended rotated and side bent right inhaled rib T9 extended rotated and side bent left L2 flexed rotated and side bent right Sacrum right on right       Assessment and Plan:    Nonallopathic problems  Decision today to treat with OMT was based on Physical Exam  After verbal consent patient was treated with HVLA, ME, FPR techniques in cervical, rib, thoracic, lumbar, and sacral  areas  Patient tolerated the procedure well with improvement in symptoms  Patient given exercises, stretches and lifestyle modifications  See medications in patient instructions if given  Patient will follow up in 4-8 weeks      The above documentation has been reviewed and is accurate and complete Bianca [name]       Note: This dictation was prepared with Dragon dictation along with smaller phrase technology. Any transcriptional errors that result from this process are unintentional.

## 2021-07-31 ENCOUNTER — Other Ambulatory Visit: Payer: Self-pay

## 2021-07-31 ENCOUNTER — Ambulatory Visit (INDEPENDENT_AMBULATORY_CARE_PROVIDER_SITE_OTHER): Payer: Medicare Other | Admitting: Family Medicine

## 2021-07-31 VITALS — BP 100/58 | HR 77 | Ht 67.0 in | Wt 160.0 lb

## 2021-07-31 DIAGNOSIS — G8929 Other chronic pain: Secondary | ICD-10-CM

## 2021-07-31 DIAGNOSIS — M9902 Segmental and somatic dysfunction of thoracic region: Secondary | ICD-10-CM | POA: Diagnosis not present

## 2021-07-31 DIAGNOSIS — M9903 Segmental and somatic dysfunction of lumbar region: Secondary | ICD-10-CM

## 2021-07-31 DIAGNOSIS — M9904 Segmental and somatic dysfunction of sacral region: Secondary | ICD-10-CM

## 2021-07-31 DIAGNOSIS — M9901 Segmental and somatic dysfunction of cervical region: Secondary | ICD-10-CM

## 2021-07-31 DIAGNOSIS — R319 Hematuria, unspecified: Secondary | ICD-10-CM

## 2021-07-31 DIAGNOSIS — M545 Low back pain, unspecified: Secondary | ICD-10-CM

## 2021-07-31 DIAGNOSIS — M9908 Segmental and somatic dysfunction of rib cage: Secondary | ICD-10-CM | POA: Diagnosis not present

## 2021-07-31 NOTE — Patient Instructions (Signed)
Good to see you.  ? ?I've ordered a kidney US to [facility]. ? ?You're getting better. ? ?Please r/s your next appt for 4-6 weeks. ? ? ? ?

## 2021-08-01 ENCOUNTER — Encounter: Payer: Self-pay | Admitting: Family Medicine

## 2021-08-01 ENCOUNTER — Ambulatory Visit (INDEPENDENT_AMBULATORY_CARE_PROVIDER_SITE_OTHER): Payer: Medicare Other

## 2021-08-01 DIAGNOSIS — R319 Hematuria, unspecified: Secondary | ICD-10-CM | POA: Insufficient documentation

## 2021-08-01 NOTE — Assessment & Plan Note (Signed)
Patient continues to have a current pack pain.  Is having some increasing in CVA tenderness and with now patient's hematuria that she continues to be seen by other providers patient was wondering if we could do any other imaging.  We discussed and will try ultrasound of the kidneys to further evaluate if there is any potential kidney stone that could be contributing.  Patient's most recent CT scans were reviewed and did not show any type of masses or anything that was too concerning at that time.  May need to consider repeating imaging at some point or a CT for a kidney stone protocol.  Patient was seen by urology recently and states that the cystoscopy was normal.  Attempted osteopathic manipulation again at this time.  Encouraged her to continue to stay active where she can.  Patient's other laboratory work-up that was revealed also today and appears to be fairly unremarkable.  Follow-up with me again in 4 to 6 weeks.  Total time with patient greater than 32 minutes ?

## 2021-08-01 NOTE — Assessment & Plan Note (Signed)
Patient is being seen by other providers.  We will get ultrasound of kidneys with patient recently having a regular cystoscopy.  Patient's most recent urinalysis continues to have hematuria but no signs of any infectious etiology. ?

## 2021-08-05 ENCOUNTER — Other Ambulatory Visit: Payer: Self-pay

## 2021-08-05 DIAGNOSIS — R1900 Intra-abdominal and pelvic swelling, mass and lump, unspecified site: Secondary | ICD-10-CM

## 2021-08-05 DIAGNOSIS — R319 Hematuria, unspecified: Secondary | ICD-10-CM

## 2021-08-05 NOTE — Progress Notes (Signed)
Orders placed.

## 2021-08-12 ENCOUNTER — Telehealth: Payer: Self-pay | Admitting: Family Medicine

## 2021-08-12 NOTE — Telephone Encounter (Signed)
Patient called to let Dr Tamala Julian know that her CT scan was read at [facility]. They said that the mass was not cancer but they are not sure what it is. ?She is scheduled to see them tomorrow. ? ?

## 2021-08-21 ENCOUNTER — Other Ambulatory Visit: Payer: Self-pay

## 2021-08-21 ENCOUNTER — Emergency Department (HOSPITAL_COMMUNITY): Payer: Medicare Other

## 2021-08-21 ENCOUNTER — Observation Stay (HOSPITAL_COMMUNITY)
Admission: EM | Admit: 2021-08-21 | Discharge: 2021-08-22 | Disposition: A | Discharge status: Home / Self Care | Payer: Medicare Other | Attending: Emergency Medicine | Admitting: Emergency Medicine

## 2021-08-21 ENCOUNTER — Encounter (HOSPITAL_COMMUNITY): Payer: Self-pay

## 2021-08-21 DIAGNOSIS — H53483 Generalized contraction of visual field, bilateral: Secondary | ICD-10-CM | POA: Insufficient documentation

## 2021-08-21 DIAGNOSIS — Z79899 Other long term (current) drug therapy: Secondary | ICD-10-CM | POA: Diagnosis not present

## 2021-08-21 DIAGNOSIS — R2990 Unspecified symptoms and signs involving the nervous system: Secondary | ICD-10-CM | POA: Diagnosis not present

## 2021-08-21 DIAGNOSIS — H5340 Unspecified visual field defects: Secondary | ICD-10-CM | POA: Insufficient documentation

## 2021-08-21 DIAGNOSIS — E061 Subacute thyroiditis: Secondary | ICD-10-CM | POA: Diagnosis not present

## 2021-08-21 DIAGNOSIS — H538 Other visual disturbances: Secondary | ICD-10-CM | POA: Insufficient documentation

## 2021-08-21 DIAGNOSIS — R4701 Aphasia: Secondary | ICD-10-CM | POA: Diagnosis not present

## 2021-08-21 DIAGNOSIS — G459 Transient cerebral ischemic attack, unspecified: Secondary | ICD-10-CM | POA: Diagnosis not present

## 2021-08-21 DIAGNOSIS — Z79890 Hormone replacement therapy (postmenopausal): Secondary | ICD-10-CM | POA: Diagnosis not present

## 2021-08-21 DIAGNOSIS — E039 Hypothyroidism, unspecified: Secondary | ICD-10-CM | POA: Diagnosis present

## 2021-08-21 DIAGNOSIS — R410 Disorientation, unspecified: Secondary | ICD-10-CM | POA: Insufficient documentation

## 2021-08-21 DIAGNOSIS — C787 Secondary malignant neoplasm of liver and intrahepatic bile duct: Secondary | ICD-10-CM | POA: Diagnosis present

## 2021-08-21 DIAGNOSIS — D696 Thrombocytopenia, unspecified: Secondary | ICD-10-CM | POA: Diagnosis not present

## 2021-08-21 DIAGNOSIS — I119 Hypertensive heart disease without heart failure: Secondary | ICD-10-CM | POA: Insufficient documentation

## 2021-08-21 DIAGNOSIS — Z85038 Personal history of other malignant neoplasm of large intestine: Secondary | ICD-10-CM | POA: Diagnosis not present

## 2021-08-21 DIAGNOSIS — R319 Hematuria, unspecified: Secondary | ICD-10-CM | POA: Diagnosis not present

## 2021-08-21 DIAGNOSIS — E785 Hyperlipidemia, unspecified: Secondary | ICD-10-CM | POA: Insufficient documentation

## 2021-08-21 DIAGNOSIS — C189 Malignant neoplasm of colon, unspecified: Secondary | ICD-10-CM | POA: Diagnosis present

## 2021-08-21 DIAGNOSIS — H539 Unspecified visual disturbance: Secondary | ICD-10-CM | POA: Diagnosis present

## 2021-08-21 DIAGNOSIS — Z20822 Contact with and (suspected) exposure to covid-19: Secondary | ICD-10-CM | POA: Insufficient documentation

## 2021-08-21 DIAGNOSIS — R29898 Other symptoms and signs involving the musculoskeletal system: Secondary | ICD-10-CM | POA: Diagnosis not present

## 2021-08-21 DIAGNOSIS — R4789 Other speech disturbances: Secondary | ICD-10-CM

## 2021-08-21 DIAGNOSIS — D649 Anemia, unspecified: Secondary | ICD-10-CM | POA: Insufficient documentation

## 2021-08-21 DIAGNOSIS — Z9221 Personal history of antineoplastic chemotherapy: Secondary | ICD-10-CM | POA: Diagnosis not present

## 2021-08-21 DIAGNOSIS — R9431 Abnormal electrocardiogram [ECG] [EKG]: Secondary | ICD-10-CM | POA: Diagnosis not present

## 2021-08-21 HISTORY — DX: Allergy to other foods: Z91.018

## 2021-08-21 LAB — CBC WITH DIFFERENTIAL/PLATELET
Abs Immature Granulocytes: 0.03 10*3/uL (ref 0.00–0.07)
Basophils Absolute: 0.1 10*3/uL (ref 0.0–0.1)
Basophils Relative: 1 %
Eosinophils Absolute: 0.1 10*3/uL (ref 0.0–0.5)
Eosinophils Relative: 2 %
HCT: 39.4 % (ref 36.0–46.0)
Hemoglobin: 12.7 g/dL (ref 12.0–15.0)
Immature Granulocytes: 0 %
Lymphocytes Relative: 29 %
Lymphs Abs: 2.7 10*3/uL (ref 0.7–4.0)
MCH: 31.5 pg (ref 26.0–34.0)
MCHC: 32.2 g/dL (ref 30.0–36.0)
MCV: 97.8 fL (ref 80.0–100.0)
Monocytes Absolute: 0.7 10*3/uL (ref 0.1–1.0)
Monocytes Relative: 8 %
Neutro Abs: 5.5 10*3/uL (ref 1.7–7.7)
Neutrophils Relative %: 60 %
Platelets: 207 10*3/uL (ref 150–400)
RBC: 4.03 MIL/uL (ref 3.87–5.11)
RDW: 12.9 % (ref 11.5–15.5)
WBC: 9.1 10*3/uL (ref 4.0–10.5)
nRBC: 0 % (ref 0.0–0.2)

## 2021-08-21 LAB — PROTIME-INR
INR: 1 (ref 0.8–1.2)
Prothrombin Time: 12.8 seconds (ref 11.4–15.2)

## 2021-08-21 LAB — CBG MONITORING, ED: Glucose-Capillary: 107 mg/dL — ABNORMAL HIGH (ref 70–99)

## 2021-08-21 LAB — BASIC METABOLIC PANEL
Anion gap: 5 (ref 5–15)
BUN: 20 mg/dL (ref 6–20)
CO2: 27 mmol/L (ref 22–32)
Calcium: 9.4 mg/dL (ref 8.9–10.3)
Chloride: 109 mmol/L (ref 98–111)
Creatinine, Ser: 0.77 mg/dL (ref 0.44–1.00)
GFR, Estimated: >60 mL/min (ref >60)
Glucose, Bld: 91 mg/dL (ref 70–99)
Potassium: 3.9 mmol/L (ref 3.5–5.1)
Sodium: 141 mmol/L (ref 135–145)

## 2021-08-21 LAB — RESP PANEL BY RT-PCR (FLU A&B, COVID) ARPGX2
Influenza A by PCR: NEGATIVE
Influenza B by PCR: NEGATIVE
SARS Coronavirus 2 by RT PCR: NEGATIVE

## 2021-08-21 LAB — APTT: aPTT: 25 seconds (ref 24–36)

## 2021-08-21 LAB — ETHANOL: Alcohol, Ethyl (B): <10 mg/dL (ref <10)

## 2021-08-21 NOTE — ED Provider Notes (Signed)
I was called to triage at approx 0900 to evaluate the patient as she was having difficulty with her words according to the patient and her friend.  I had initially seen the patient in triage when she presented with concern for visual deficits that occurred at 730 pm while driving, which had completely resolved upon arrival in the ER.  She had described bilateral ocular outside field deficits, visual cuts "greyed out" which was not consistent with a single brain lesion per my evaluation, and she did have a completely benign neurological exam at that time.  Subsequently she was returned to the waiting room where she reported developing difficulty with speech, confirmed by her friend.  I immediately reevaluated the patient at 1030 pm and found her speech was slow, although not slurred.  Given this constellation of developing neurological symptoms, I activated a Code stroke with [place] approx 930 pm in the waiting room per their report. ? ?She has a history of metastatic colon cancer which she reports is in remission. ?  ?Wyvonnia Dusky, MD ?08/21/21 2240 ? ?

## 2021-08-21 NOTE — ED Triage Notes (Addendum)
Pt reports tonight around 7pm she was driving her car when suddenly she lost her peripheral vision "I could only see through the middle," Episode lasted about 45 minutes. She reports numbness to her eyelids, nose and tongue. ?She reports she woke up this morning and noticed both of her arms were numb, but not currently.  ?

## 2021-08-21 NOTE — ED Notes (Signed)
Pt reports she had an episode of "word scramble" in which she was having trouble coming up with the correct words and getting them mixed up. This episode only lasted about a minute. This Rn went into lobby to assess patient and her speech was back to normal and was speaking in clear complete sentences. Dr. Langston Masker informed of episode. ?

## 2021-08-21 NOTE — Consult Note (Addendum)
Neurology Consultation ? ?Reason for Consult: code stroke - aphasia, confusion, visual field deficits ?Referring Physician: Dr Myrtie Cruise, EDP ? ?CC: aphasia, confusion, visual field deficits ? ?History is obtained from: Patient, chart ? ?HPI: Bianca [name] is a 56 y.o. female past medical history of colon cancer metastatic to the liver, in remission, anemia, subacute thyroiditis, ongoing hematuria due to possible hemorrhagic renal cyst, presenting to the emergency room for evaluation of sudden onset of neurological symptoms while driving. ?She reports that she was driving back from the beach, had a very busy day there and was out in the sun for a while when she noted that her vision had become foggy in both eyes and almost had tunnel vision with loss of vision in the visual fields peripherally on both sides.  Initial examination at the ER triage was reassuring.  She was waiting for a bed assignment when her friend was accompanying her noticed that her speech had become somewhat confused.  Patient did not appear like her normal cognizant self.  On EDP evaluation, she was having word finding difficulty for which a code stroke was activated. ?On my examination, symptoms are resolved and NIH stroke scale was 0. ?Denies a history of headaches or migraines.  Denies history of similar episodes in the past. ?No chest pain shortness of breath nausea vomiting ? ? ?LKW: 2130 hrs ?tpa given?: no, NIHSS 0 ?Premorbid modified Rankin scale (mRS): 0 ? ? ?ROS: Full ROS was performed and is negative except as noted in the HPI.  ? ?Past Medical History:  ?Diagnosis Date  ? Allergy to alpha-gal   ? Thyroid disease   ? ?Family History  ?Problem Relation Age of Onset  ? Parkinson's disease Father   ? ?Social History:  ? reports that she has never smoked. She does not have any smokeless tobacco history on file. She reports that she does not drink alcohol and does not use drugs. ? ?Medications ?No current facility-administered  medications for this encounter. ? ?Current Outpatient Medications:  ?  albuterol (VENTOLIN HFA) 108 (90 Base) MCG/ACT inhaler, Inhale 2 puffs into the lungs every 4 (four) hours as needed for wheezing or shortness of breath., Disp: 1 each, Rfl: 6 ?  amoxicillin-clavulanate (AUGMENTIN) 875-125 MG per tablet, Take 1 tablet by mouth 2 (two) times daily. Take with food, Disp: 10 tablet, Rfl: 0 ?  beclomethasone (QVAR REDIHALER) 40 MCG/ACT inhaler, Inhale 1 puff into the lungs 2 (two) times daily., Disp: 1 each, Rfl: 0 ?  benzonatate (TESSALON) 200 MG capsule, Take 1 capsule (200 mg total) by mouth at bedtime., Disp: 12 capsule, Rfl: 0 ?  levothyroxine (SYNTHROID, LEVOTHROID) 25 MCG tablet, Take 25 mcg by mouth daily., Disp: , Rfl:  ?  loperamide (IMODIUM A-D) 2 MG tablet, , Disp: , Rfl:  ?  midodrine (PROAMATINE) 2.5 MG tablet, Take by mouth at bedtime., Disp: , Rfl:  ?  tiZANidine (ZANAFLEX) 2 MG tablet, Take 1 tablet (2 mg total) by mouth at bedtime., Disp: 30 tablet, Rfl: 0 ? ?Exam: ?Current vital signs: ?BP 133/66   Pulse 61   Temp 98.9 ?F (37.2 ?C) (Oral)   Resp 16   SpO2 98%  ?Vital signs in last 24 hours: ?Temp:  [98.9 ?F (37.2 ?C)] 98.9 ?F (37.2 ?C) (04/06 2102) ?Pulse Rate:  [61] 61 (04/06 2102) ?Resp:  [16] 16 (04/06 2102) ?BP: (133)/(66) 133/66 (04/06 2102) ?SpO2:  [98 %] 98 % (04/06 2102) ?GENERAL: Awake, alert in NAD ?HEENT: - Normocephalic and atraumatic,  dry mm, no LN++, no Thyromegally ?LUNGS - Clear to auscultation bilaterally with no wheezes ?CV - S1S2 RRR, no m/r/g, equal pulses bilaterally. ?ABDOMEN - Soft, nontender, nondistended with normoactive BS ?Ext: warm, well perfused, intact peripheral pulses, no edema ? ?NEURO:  ?Mental Status: AA&Ox3 ?Language: speech is clear.  Naming, repetition, fluency, and comprehension intact. ?Cranial Nerves: PERRL,OMI, visual fields full, no facial asymmetry, facial sensation intact, hearing intact, tongue/uvula/soft palate midline, normal sternocleidomastoid  and trapezius muscle strength. No evidence of tongue atrophy or fibrillations ?Motor: 5/5 in all fours ?Tone: is normal and bulk is normal ?Sensation- Intact to light touch bilaterally ?Coordination: FTN intact bilaterally, no ataxia in BLE. ?Gait- deferred ? ?NIHSS--0 ? ? ?Labs ?I have reviewed labs in epic and the results pertinent to this consultation are: ? ?CBC ?   ?Component Value Date/Time  ? WBC 9.1 08/21/2021 2139  ? RBC 4.03 08/21/2021 2139  ? HGB 12.7 08/21/2021 2139  ? HCT 39.4 08/21/2021 2139  ? PLT 207 08/21/2021 2139  ? MCV 97.8 08/21/2021 2139  ? MCH 31.5 08/21/2021 2139  ? MCHC 32.2 08/21/2021 2139  ? RDW 12.9 08/21/2021 2139  ? LYMPHSABS 2.7 08/21/2021 2139  ? MONOABS 0.7 08/21/2021 2139  ? EOSABS 0.1 08/21/2021 2139  ? BASOSABS 0.1 08/21/2021 2139  ? ? ?CMP  ?   ?Component Value Date/Time  ? NA 141 08/21/2021 2139  ? K 3.9 08/21/2021 2139  ? CL 109 08/21/2021 2139  ? CO2 27 08/21/2021 2139  ? GLUCOSE 91 08/21/2021 2139  ? BUN 20 08/21/2021 2139  ? CREATININE 0.77 08/21/2021 2139  ? CALCIUM 9.4 08/21/2021 2139  ? PROT 6.4 (L) 08/29/2020 1850  ? ALBUMIN 3.6 08/29/2020 1850  ? AST 21 08/29/2020 1850  ? ALT 17 08/29/2020 1850  ? ALKPHOS 53 08/29/2020 1850  ? BILITOT 1.6 (H) 08/29/2020 1850  ? GFRNONAA >60 08/21/2021 2139  ? ? ? ?Imaging ?I have reviewed the images obtained: ? ?CT-head-aspects 10 ? ?Assessment:  ?56 year old with history of colon cancer metastatic to the liver in remission presenting to the emergency room for evaluation of sudden onset of bilateral visual field deficits which resolved followed by brief episode of what sounds like expressive aphasia. ?Given her history of cancer, stroke/TIA should be on the differential. ?Symptoms resolved-NIH 0-hence not a candidate for IV thrombolytics.  Exam not consistent with large vessel occlusion hence not a candidate for thrombectomy. ?Other differentials to consider are seizures.  Does not have a history of headaches but could be new onset  complicated migraine as well. If all remains negative, symptoms could reflect psychosomatic etiology but given her risk factors, it is prudent to rule out organic causes first. ? ?Impression ?Evaluate for stroke/TIA ?Evaluate for complicated migraine ?Evaluate for seizures ? ?Recommendations: ?Admit to hospitalist ?Frequent neurochecks ?Aspirin 81 ?MRI of the brain with and without contrast given history of metastatic colon cancer ?MRA head without contrast ?Carotid Doppler ?A1c ?Lipid panel ?PT ?OT ?Speech therapy ?Hypercoagulable work-up ?Routine EEG in the AM ? ?Stroke team will follow ? ?Plan d/w Dr. Leonette Monarch ? ?-- ?Amie Portland, MD ?Neurologist ?[facility] ?Pager: [phone] ? ?

## 2021-08-21 NOTE — ED Provider Triage Note (Signed)
Emergency Medicine Provider Triage Evaluation Note ? ?Bianca [name] , a 56 y.o. female  was evaluated in triage.  Pt complains of ~ 40 minutes of vision changes/loss that has resolved. She notes that she had vision changes to her peripheral vision. She was able to see from her superior/mid eye vision. Denies headache.  ? ? ?Review of Systems  ?Positive: As per HPI above ?Negative:  ? ?Physical Exam  ?BP 133/66   Pulse 61   Temp 98.9 ?F (37.2 ?C) (Oral)   Resp 16   SpO2 98%  ?Gen:   Awake, no distress   ?Resp:  Normal effort  ?MSK:   Moves extremities without difficulty  ?Other:  Negative pronator drift. Normal finger to nose. No focal neuro deficits.  ? ?Medical Decision Making  ?Medically screening exam initiated at 9:33 PM.  Appropriate orders placed.  Bianca [name] was informed that the remainder of the evaluation will be completed by another provider, this initial triage assessment does not replace that evaluation, and the importance of remaining in the ED until their evaluation is complete. ? ?9:38 PM - Pt evaluated by Attending at bedside who recommends MRI.  ?  ?Cambelle Suchecki A, PA-C ?08/21/21 2140 ? ?

## 2021-08-22 ENCOUNTER — Observation Stay (HOSPITAL_COMMUNITY): Payer: Medicare Other

## 2021-08-22 ENCOUNTER — Observation Stay (HOSPITAL_BASED_OUTPATIENT_CLINIC_OR_DEPARTMENT_OTHER): Payer: Medicare Other

## 2021-08-22 ENCOUNTER — Encounter (HOSPITAL_COMMUNITY): Payer: Self-pay | Admitting: Internal Medicine

## 2021-08-22 DIAGNOSIS — C787 Secondary malignant neoplasm of liver and intrahepatic bile duct: Secondary | ICD-10-CM

## 2021-08-22 DIAGNOSIS — R4789 Other speech disturbances: Secondary | ICD-10-CM

## 2021-08-22 DIAGNOSIS — G459 Transient cerebral ischemic attack, unspecified: Secondary | ICD-10-CM

## 2021-08-22 DIAGNOSIS — C189 Malignant neoplasm of colon, unspecified: Secondary | ICD-10-CM

## 2021-08-22 DIAGNOSIS — H53489 Generalized contraction of visual field, unspecified eye: Secondary | ICD-10-CM | POA: Diagnosis not present

## 2021-08-22 DIAGNOSIS — R2990 Unspecified symptoms and signs involving the nervous system: Secondary | ICD-10-CM

## 2021-08-22 DIAGNOSIS — G43009 Migraine without aura, not intractable, without status migrainosus: Secondary | ICD-10-CM | POA: Diagnosis not present

## 2021-08-22 DIAGNOSIS — H539 Unspecified visual disturbance: Secondary | ICD-10-CM

## 2021-08-22 DIAGNOSIS — E039 Hypothyroidism, unspecified: Secondary | ICD-10-CM | POA: Diagnosis not present

## 2021-08-22 LAB — CBC WITH DIFFERENTIAL/PLATELET
Abs Immature Granulocytes: 0.03 10*3/uL (ref 0.00–0.07)
Basophils Absolute: 0.1 10*3/uL (ref 0.0–0.1)
Basophils Relative: 1 %
Eosinophils Absolute: 0.1 10*3/uL (ref 0.0–0.5)
Eosinophils Relative: 2 %
HCT: 37 % (ref 36.0–46.0)
Hemoglobin: 12.2 g/dL (ref 12.0–15.0)
Immature Granulocytes: 0 %
Lymphocytes Relative: 25 %
Lymphs Abs: 1.8 10*3/uL (ref 0.7–4.0)
MCH: 32.3 pg (ref 26.0–34.0)
MCHC: 33 g/dL (ref 30.0–36.0)
MCV: 97.9 fL (ref 80.0–100.0)
Monocytes Absolute: 0.6 10*3/uL (ref 0.1–1.0)
Monocytes Relative: 9 %
Neutro Abs: 4.5 10*3/uL (ref 1.7–7.7)
Neutrophils Relative %: 63 %
Platelets: 149 10*3/uL — ABNORMAL LOW (ref 150–400)
RBC: 3.78 MIL/uL — ABNORMAL LOW (ref 3.87–5.11)
RDW: 12.9 % (ref 11.5–15.5)
WBC: 7.1 10*3/uL (ref 4.0–10.5)
nRBC: 0 % (ref 0.0–0.2)

## 2021-08-22 LAB — URINALYSIS, ROUTINE W REFLEX MICROSCOPIC
Bacteria, UA: NONE SEEN
Bilirubin Urine: NEGATIVE
Glucose, UA: NEGATIVE mg/dL
Ketones, ur: NEGATIVE mg/dL
Leukocytes,Ua: NEGATIVE
Nitrite: NEGATIVE
Protein, ur: NEGATIVE mg/dL
Specific Gravity, Urine: 1.01 (ref 1.005–1.030)
pH: 7 (ref 5.0–8.0)

## 2021-08-22 LAB — I-STAT BETA HCG BLOOD, ED (MC, WL, AP ONLY): I-stat hCG, quantitative: <5 m[IU]/mL (ref <5)

## 2021-08-22 LAB — ECHOCARDIOGRAM COMPLETE
AR max vel: 2.86 cm2
AV Area VTI: 2.85 cm2
AV Area mean vel: 2.75 cm2
AV Mean grad: 2 mmHg
AV Peak grad: 4.5 mmHg
Ao pk vel: 1.06 m/s
Area-P 1/2: 3.11 cm2
MV VTI: 2.2 cm2
S' Lateral: 2.2 cm

## 2021-08-22 LAB — LIPID PANEL
Cholesterol: 159 mg/dL (ref 0–200)
HDL: 49 mg/dL (ref >40)
LDL Cholesterol: 99 mg/dL (ref 0–99)
Total CHOL/HDL Ratio: 3.2 RATIO
Triglycerides: 56 mg/dL (ref <150)
VLDL: 11 mg/dL (ref 0–40)

## 2021-08-22 LAB — I-STAT CHEM 8, ED
BUN: 24 mg/dL — ABNORMAL HIGH (ref 6–20)
Calcium, Ion: 1.23 mmol/L (ref 1.15–1.40)
Chloride: 106 mmol/L (ref 98–111)
Creatinine, Ser: 0.7 mg/dL (ref 0.44–1.00)
Glucose, Bld: 91 mg/dL (ref 70–99)
HCT: 39 % (ref 36.0–46.0)
Hemoglobin: 13.3 g/dL (ref 12.0–15.0)
Potassium: 3.8 mmol/L (ref 3.5–5.1)
Sodium: 142 mmol/L (ref 135–145)
TCO2: 28 mmol/L (ref 22–32)

## 2021-08-22 LAB — COMPREHENSIVE METABOLIC PANEL
ALT: 20 U/L (ref 0–44)
AST: 21 U/L (ref 15–41)
Albumin: 3.3 g/dL — ABNORMAL LOW (ref 3.5–5.0)
Alkaline Phosphatase: 85 U/L (ref 38–126)
Anion gap: 5 (ref 5–15)
BUN: 15 mg/dL (ref 6–20)
CO2: 25 mmol/L (ref 22–32)
Calcium: 9 mg/dL (ref 8.9–10.3)
Chloride: 112 mmol/L — ABNORMAL HIGH (ref 98–111)
Creatinine, Ser: 0.65 mg/dL (ref 0.44–1.00)
GFR, Estimated: >60 mL/min (ref >60)
Glucose, Bld: 107 mg/dL — ABNORMAL HIGH (ref 70–99)
Potassium: 4 mmol/L (ref 3.5–5.1)
Sodium: 142 mmol/L (ref 135–145)
Total Bilirubin: 0.5 mg/dL (ref 0.3–1.2)
Total Protein: 5.6 g/dL — ABNORMAL LOW (ref 6.5–8.1)

## 2021-08-22 LAB — HIV ANTIBODY (ROUTINE TESTING W REFLEX): HIV Screen 4th Generation wRfx: NONREACTIVE

## 2021-08-22 LAB — HEMOGLOBIN A1C
Hgb A1c MFr Bld: 5.2 % (ref 4.8–5.6)
Mean Plasma Glucose: 102.54 mg/dL

## 2021-08-22 LAB — PHOSPHORUS: Phosphorus: 3.8 mg/dL (ref 2.5–4.6)

## 2021-08-22 LAB — ANTITHROMBIN III: AntiThromb III Func: 109 % (ref 75–120)

## 2021-08-22 LAB — RAPID URINE DRUG SCREEN, HOSP PERFORMED
Amphetamines: NOT DETECTED
Barbiturates: NOT DETECTED
Benzodiazepines: NOT DETECTED
Cocaine: NOT DETECTED
Opiates: NOT DETECTED
Tetrahydrocannabinol: NOT DETECTED

## 2021-08-22 LAB — MAGNESIUM: Magnesium: 2.2 mg/dL (ref 1.7–2.4)

## 2021-08-22 MED ORDER — ASPIRIN EC 81 MG PO TBEC: 81.0000 mg | DELAYED_RELEASE_TABLET | Freq: Every day | ORAL | 11 refills | Status: AC

## 2021-08-22 MED ORDER — STROKE: EARLY STAGES OF RECOVERY BOOK: Freq: Once | Status: DC

## 2021-08-22 MED ORDER — ASPIRIN 325 MG PO TABS
325.0000 mg | ORAL_TABLET | Freq: Every day | ORAL | Status: DC
  Filled 2021-08-22: qty 1

## 2021-08-22 MED ORDER — ACETAMINOPHEN 650 MG RE SUPP: 650.0000 mg | RECTAL | Status: DC | PRN

## 2021-08-22 MED ORDER — FONDAPARINUX SODIUM 2.5 MG/0.5ML ~~LOC~~ SOLN
2.5000 mg | Freq: Every day | SUBCUTANEOUS | Status: DC
  Filled 2021-08-22: qty 0.5

## 2021-08-22 MED ORDER — SODIUM CHLORIDE 0.9 % IV BOLUS (SEPSIS)
1000.0000 mL | Freq: Once | INTRAVENOUS | Status: AC
Start: 2021-08-22 — End: 2021-08-22
  Administered 2021-08-22: 1000 mL via INTRAVENOUS

## 2021-08-22 MED ORDER — SODIUM CHLORIDE 0.9 % IV SOLN: 1000.0000 mL | INTRAVENOUS | Status: DC

## 2021-08-22 MED ORDER — GADOBUTROL 1 MMOL/ML IV SOLN
6.0000 mL | Freq: Once | INTRAVENOUS | Status: AC | PRN
  Administered 2021-08-22: 6 mL via INTRAVENOUS

## 2021-08-22 MED ORDER — ACETAMINOPHEN 160 MG/5ML PO SOLN: 650.0000 mg | ORAL | Status: DC | PRN

## 2021-08-22 MED ORDER — ASPIRIN 300 MG RE SUPP: 300.0000 mg | Freq: Every day | RECTAL | Status: DC

## 2021-08-22 MED ORDER — ACETAMINOPHEN 325 MG PO TABS: 650.0000 mg | ORAL_TABLET | ORAL | Status: DC | PRN

## 2021-08-22 NOTE — Progress Notes (Signed)
Patient received from ED via stretcher; patient is alert and oriented; oriented patient to room and unit routine.  Patient is ambulating independently in the room.  Fall safety reviewed. ?

## 2021-08-22 NOTE — ED Notes (Signed)
Patient transported to MRI 

## 2021-08-22 NOTE — Progress Notes (Signed)
Carotid duplex has been completed.  ? ?Preliminary results in CV Proc.  ? ?Bianca [name] [name] ?08/22/2021 9:39 AM    ?

## 2021-08-22 NOTE — Progress Notes (Signed)
SLP Cancellation Note ? ?Patient Details ?Name: Bianca [name] ?MRN: [id number] ?DOB: 21-Oct-1965 ? ? ?Cancelled treatment:       Reason Eval/Treat Not Completed: SLP screened, no needs identified, will sign off ? ?Aliya Sol L. Dameer Speiser, MA CCC/SLP ?[facility] ?Office number [phone] ?Pager [phone] ? ? ? ?Juan Quam Laurice ?08/22/2021, 1:49 PM ?

## 2021-08-22 NOTE — Discharge Summary (Signed)
Physician Discharge Summary  ?Bianca [name] [id number] DOB: 12/25/1965 DOA: 08/21/2021 ? ?PCP: Teodoro Kil, PA-C ? ?Admit date: 08/21/2021 ?Discharge date: 08/22/2021 ? ?Admitted From: Home ?Disposition: Home ? ?Recommendations for Outpatient Follow-up:  ?Follow up with PCP in 1-2 weeks ?Follow up with Ophthalmology if necessary as an outpatient   ?Please obtain CMP/CBC, Mag, Phos in one week ?Please follow up on the following pending results: Hypercoagulable panel was ordered ? ?Home Health: No  ?Equipment/Devices: None   ? ?Discharge Condition: Stable  ?CODE STATUS: FULL CODE  ?Diet recommendation: Heart Healthy Diet ? ?Brief/Interim Summary: ?HPI per Dr. Gean Birchwood on 08/22/21 ?Bianca [name] is a 56 y.o. female with history of stage IV colon cancer follows up with [facility] was driving back from the beach when patient experienced tunnel vision in both eyes for almost half hour.  Patient was able to see things through a hollow.  Did not have any weakness of the extremities.  While waiting in the ER patient's friend noted that patient had some difficulty bringing out words for few minutes. ?  ?ED Course: In the ER CT head followed by MRI brain with and without contrast and MRA brain were negative.  Neurologist on-call requested admission for further observation and also get EEG.  Labs are largely unremarkable.  EKG shows sinus rhythm. ? ?**Interim History ?She underwent neurological work-up and this is all negative.  Her episode of painless blurred vision in the form of tunnel vision and subsequent speech difficulties could have been a atypical migraine versus nonneurological cause with possible dehydration and exhaustion.  Neurology recommended adding aspirin 81 mg and she has another episode of this tunnel vision that she will need to follow-up with ophthalmology in outpatient setting given that she had a negative neurological work-up.  She was deemed medically stable for discharge  at this time and follow-up with PCP in outpatient setting ? ?Discharge Diagnoses:  ?Principal Problem: ?  TIA (transient ischemic attack) ?Active Problems: ?  Metastatic colon cancer to liver [facility]) ?  Acquired hypothyroidism ? ?Atypical ocular migraine versus exhaustion and nonneurological cause resulting in tunnel vision which is transient and short-lived and could have; Unlikely TIA ?-appreciate neurology consult.   ?-MRI brain with and without contrast and MRA brain was not showing anything acute.  -Patient has passed stroke swallow.   ?-We will check hemoglobin A1c lipid panel 2D echo and carotid Doppler.   ?-On neurochecks.  Further recommendations per neurology. ?-Carotid Dopplers were normal and bilateral vertebral artery demonstrated antegrade flow and her 2D echo showed an EF of 60 to 65% with no hemodynamically significant valvular disease ?-Patient had a EEG done which was within normal limits and no seizures or epileptiform discharges were seen throughout the recording ?-Hemoglobin A1c was 5.2 and LDL was 99 ?-Hypercoagulable panel was ordered and still pending full results ?-Neurology recommends discharging home on aspirin 81 mg and following up with ophthalmology as this happens again ? ?Hypothyroidism  ?-On Levothyroxine. ? ?Thrombocytopenia ?-Patient's platelet count is now 149 with no signs or symptoms of bleeding and drop from 207 slightly ?-Repeat CBC within 1 week ? ?Stage IV colon cancer  ?-being followed by oncologist at [facility]. ? ?Discharge Instructions ? ?Discharge Instructions   ? ? Call MD for:  difficulty breathing, headache or visual disturbances   Complete by: As directed ?  ? Call MD for:  extreme fatigue   Complete by: As directed ?  ? Call MD for:  hives  Complete by: As directed ?  ? Call MD for:  persistant dizziness or light-headedness   Complete by: As directed ?  ? Call MD for:  persistant nausea and vomiting   Complete by: As directed ?  ? Call MD for:  redness,  tenderness, or signs of infection (pain, swelling, redness, odor or green/yellow discharge around incision site)   Complete by: As directed ?  ? Call MD for:  severe uncontrolled pain   Complete by: As directed ?  ? Call MD for:  temperature >100.4   Complete by: As directed ?  ? Diet - low sodium heart healthy   Complete by: As directed ?  ? Discharge instructions   Complete by: As directed ?  ? You were cared for by a hospitalist during your hospital stay. If you have any questions about your discharge medications or the care you received while you were in the hospital after you are discharged, you can call the unit and ask to speak with the hospitalist on call if the hospitalist that took care of you is not available. Once you are discharged, your primary care physician will handle any further medical issues. Please note that NO REFILLS for any discharge medications will be authorized once you are discharged, as it is imperative that you return to your primary care physician (or establish a relationship with a primary care physician if you do not have one) for your aftercare needs so that they can reassess your need for medications and monitor your lab values. ? ?Follow up with PCP and Ophthalmology if necessary. Take all medications as prescribed. If symptoms change or worsen please return to the ED for evaluation  ? Increase activity slowly   Complete by: As directed ?  ? ?  ? ?Allergies as of 08/22/2021   ? ?   Reactions  ? Atropine Sulfate Other (See Comments)  ? Low BP; numbness in both arms   ? Beef (bovine) Protein Anaphylaxis  ? Alpha-gal syndrome  ? Beef-derived Products Anaphylaxis  ? Alpha-gal syndrome  ? Gelatin Anaphylaxis  ? Alpha-gal syndrome  ? Heparin Anaphylaxis  ? Porcine heparin - Alpha-gal syndrome  ? Heparin (bovine) Anaphylaxis  ? Alpha-gal syndrome  ? Lac [place] Anaphylaxis  ? Alpha-gal syndrome  ? Milk-related Compounds Anaphylaxis  ? Alpha-gal syndrome  ? Pork-derived Products Anaphylaxis   ? Alpha-gal syndrome  ? Sulfa Antibiotics Anaphylaxis  ? Prochlorperazine   ? Lowered BP; fainted  ? Shellfish Allergy Hives  ? Had reaction once to shrimp  ? ?  ? ?  ?Medication List  ?  ? ?TAKE these medications   ? ?aspirin EC 81 MG tablet ?Take 1 tablet (81 mg total) by mouth daily. Swallow whole. ?  ?bismuth subsalicylate 099 IP/38SN suspension ?Commonly known as: PEPTO BISMOL ?Take 30 mLs by mouth daily as needed for indigestion or diarrhea or loose stools. ?  ?EPINEPHrine 0.3 mg/0.3 mL Soaj injection ?Commonly known as: EPI-PEN ?Inject 0.3 mg into the muscle once as needed for anaphylaxis. ?  ?levothyroxine 137 MCG tablet ?Commonly known as: SYNTHROID ?Take 68.5 mcg by mouth daily. ?  ?Qvar RediHaler 40 MCG/ACT inhaler ?Generic drug: beclomethasone ?Inhale 1 puff into the lungs 2 (two) times daily. ?  ? ?  ? ? ?Allergies  ?Allergen Reactions  ? Atropine Sulfate Other (See Comments)  ?  Low BP; numbness in both arms   ? Beef (Bovine) Protein Anaphylaxis  ?  Alpha-gal syndrome ?  ? Beef-Derived Products Anaphylaxis  ?  Alpha-gal syndrome ?  ? Gelatin Anaphylaxis  ?  Alpha-gal syndrome ?  ? Heparin Anaphylaxis  ?  Porcine heparin - Alpha-gal syndrome ? ?  ? Heparin (Bovine) Anaphylaxis  ?  Alpha-gal syndrome  ? Lac Bovis Anaphylaxis  ?  Alpha-gal syndrome ?  ? Milk-Related Compounds Anaphylaxis  ?  Alpha-gal syndrome ?  ? Pork-Derived Products Anaphylaxis  ?  Alpha-gal syndrome ?  ? Sulfa Antibiotics Anaphylaxis  ? Prochlorperazine   ?  Lowered BP; fainted ?  ? Shellfish Allergy Hives  ?  Had reaction once to shrimp  ? ? ?Consultations: ?Neurology ? ?Procedures/Studies: ?MR ANGIO HEAD WO CONTRAST ? ?Result Date: 08/22/2021 ?CLINICAL DATA:  Vision loss and numbness EXAM: MRI HEAD WITHOUT AND WITH CONTRAST MRA HEAD WITHOUT CONTRAST TECHNIQUE: Multiplanar, multi-echo pulse sequences of the brain and surrounding structures were acquired without and with intravenous contrast. Angiographic images of the [place] were acquired using MRA technique without intravenous contrast. CONTRAST:  41m GADAVIST GADOBUTROL 1 MMOL/ML IV SOLN COMPARISON:  No pertinent prior exam. FINDINGS: MRI HEAD FINDINGS Brain: No acute infarc

## 2021-08-22 NOTE — ED Notes (Signed)
Patient transported to echo ?

## 2021-08-22 NOTE — Progress Notes (Addendum)
STROKE TEAM PROGRESS NOTE  ? ?INTERVAL HISTORY ?Patient had an episode of tunnel vision that occurred while driving home from the beach with her friend. This lasted approximately 30 minutes before coming back less severe and fluctuating for a few hours. While in the ED her friend noticed that she was having difficulty with her speech.  Patient endorses that she may have pushed herself too hard this past week and she typically has "chemo brain" with her speech when she is tired. She states that she would describe herself as exhausted.  ?MRI and MRA showed no acute abnormalities.  On assessment this morning, pt report her speech and memory is better.  ? ?Vitals:  ? 08/22/21 0000 08/22/21 0225 08/22/21 0300 08/22/21 0600  ?BP: 115/68 106/68 102/70 116/72  ?Pulse: 65 71 (!) 58 (!) 55  ?Resp: 16 15 (!) 27 12  ?Temp:      ?TempSrc:      ?SpO2: 98% 100% 100% 98%  ? ?CBC:  ?Recent Labs  ?Lab 08/21/21 ?2139 08/22/21 ?2458  ?WBC 9.1  --   ?NEUTROABS 5.5  --   ?HGB 12.7 13.3  ?HCT 39.4 39.0  ?MCV 97.8  --   ?PLT 207  --   ? ?Basic Metabolic Panel:  ?Recent Labs  ?Lab 08/21/21 ?2139 08/22/21 ?0998  ?NA 141 142  ?K 3.9 3.8  ?CL 109 106  ?CO2 27  --   ?GLUCOSE 91 91  ?BUN 20 24*  ?CREATININE 0.77 0.70  ?CALCIUM 9.4  --   ? ?Lipid Panel: No results for input(s): CHOL, TRIG, HDL, CHOLHDL, VLDL, LDLCALC in the last 168 hours. ?HgbA1c: No results for input(s): HGBA1C in the last 168 hours. ?Urine Drug Screen:  ?Recent Labs  ?Lab 08/22/21 ?0010  ?LABOPIA NONE DETECTED  ?COCAINSCRNUR NONE DETECTED  ?LABBENZ NONE DETECTED  ?AMPHETMU NONE DETECTED  ?THCU NONE DETECTED  ?LABBARB NONE DETECTED  ?  ?Alcohol Level  ?Recent Labs  ?Lab 08/21/21 ?2245  ?ETH <10  ? ? ?IMAGING past 24 hours ?MR ANGIO HEAD WO CONTRAST ? ?Result Date: 08/22/2021 ?CLINICAL DATA:  Vision loss and numbness EXAM: MRI HEAD WITHOUT AND WITH CONTRAST MRA HEAD WITHOUT CONTRAST TECHNIQUE: Multiplanar, multi-echo pulse sequences of the brain and surrounding structures were  acquired without and with intravenous contrast. Angiographic images of the Circle of Willis were acquired using MRA technique without intravenous contrast. CONTRAST:  18m GADAVIST GADOBUTROL 1 MMOL/ML IV SOLN COMPARISON:  No pertinent prior exam. FINDINGS: MRI HEAD FINDINGS Brain: No acute infarct, mass effect or extra-axial collection. No acute or chronic hemorrhage. Normal white matter signal, parenchymal volume and CSF spaces. The midline structures are normal. There is no abnormal contrast enhancement. Vascular: Major flow voids are preserved. Skull and upper cervical spine: Normal calvarium and skull base. Visualized upper cervical spine and soft tissues are normal. Sinuses/Orbits:No paranasal sinus fluid levels or advanced mucosal thickening. No mastoid or middle ear effusion. Normal orbits. MRA HEAD FINDINGS POSTERIOR CIRCULATION: --Vertebral arteries: Normal --Inferior cerebellar arteries: Normal. --Basilar artery: Normal. --Superior cerebellar arteries: Normal. --Posterior cerebral arteries: Normal. ANTERIOR CIRCULATION: --Intracranial internal carotid arteries: Normal. --Anterior cerebral arteries (ACA): Normal. --Middle cerebral arteries (MCA): Normal. ANATOMIC VARIANTS: Both posterior communicating arteries are patent IMPRESSION: Normal MRI and MRA of the brain. Electronically Signed   By: KUlyses JarredM.D.   On: 08/22/2021 02:30  ? ?MR Brain W and Wo Contrast ? ?Result Date: 08/22/2021 ?CLINICAL DATA:  Vision loss and numbness EXAM: MRI HEAD WITHOUT AND WITH CONTRAST MRA HEAD  WITHOUT CONTRAST TECHNIQUE: Multiplanar, multi-echo pulse sequences of the brain and surrounding structures were acquired without and with intravenous contrast. Angiographic images of the Circle of Willis were acquired using MRA technique without intravenous contrast. CONTRAST:  77m GADAVIST GADOBUTROL 1 MMOL/ML IV SOLN COMPARISON:  No pertinent prior exam. FINDINGS: MRI HEAD FINDINGS Brain: No acute infarct, mass effect or  extra-axial collection. No acute or chronic hemorrhage. Normal white matter signal, parenchymal volume and CSF spaces. The midline structures are normal. There is no abnormal contrast enhancement. Vascular: Major flow voids are preserved. Skull and upper cervical spine: Normal calvarium and skull base. Visualized upper cervical spine and soft tissues are normal. Sinuses/Orbits:No paranasal sinus fluid levels or advanced mucosal thickening. No mastoid or middle ear effusion. Normal orbits. MRA HEAD FINDINGS POSTERIOR CIRCULATION: --Vertebral arteries: Normal --Inferior cerebellar arteries: Normal. --Basilar artery: Normal. --Superior cerebellar arteries: Normal. --Posterior cerebral arteries: Normal. ANTERIOR CIRCULATION: --Intracranial internal carotid arteries: Normal. --Anterior cerebral arteries (ACA): Normal. --Middle cerebral arteries (MCA): Normal. ANATOMIC VARIANTS: Both posterior communicating arteries are patent IMPRESSION: Normal MRI and MRA of the brain. Electronically Signed   By: KUlyses JarredM.D.   On: 08/22/2021 02:30  ? ?CT HEAD CODE STROKE WO CONTRAST ? ?Result Date: 08/21/2021 ?CLINICAL DATA:  Code stroke.  Aphasia EXAM: CT HEAD WITHOUT CONTRAST TECHNIQUE: Contiguous axial images were obtained from the base of the skull through the vertex without intravenous contrast. RADIATION DOSE REDUCTION: This exam was performed according to the departmental dose-optimization program which includes automated exposure control, adjustment of the mA and/or kV according to patient size and/or use of iterative reconstruction technique. COMPARISON:  None. FINDINGS: Brain: There is no mass, hemorrhage or extra-axial collection. The size and configuration of the ventricles and extra-axial CSF spaces are normal. The brain parenchyma is normal, without evidence of acute or chronic infarction. Vascular: No abnormal hyperdensity of the major intracranial arteries or dural venous sinuses. No intracranial atherosclerosis.  Skull: The visualized skull base, calvarium and extracranial soft tissues are normal. Sinuses/Orbits: No fluid levels or advanced mucosal thickening of the visualized paranasal sinuses. No mastoid or middle ear effusion. The orbits are normal. ASPECTS ([facility] Early CT Score) - Ganglionic level infarction (caudate, lentiform nuclei, internal capsule, insula, M1-M3 cortex): 7 - Supraganglionic infarction (M4-M6 cortex): 3 Total score (0-10 with 10 being normal): 10 IMPRESSION: 1. No acute intracranial abnormality. 2. ASPECTS is 10. These results were communicated to Dr. AAmie Portlandat 11:07 pm on 08/21/2021 by text page via the [facility]aging system. Electronically Signed   By: KUlyses JarredM.D.   On: 08/21/2021 23:07   ? ?PHYSICAL EXAM ? ?Physical Exam  ?Constitutional: Appears well-developed and well-nourished.  Pleasant middle-age Caucasian lady not in distress ?Cardiovascular: Normal rate and regular rhythm.  ?Respiratory: Effort normal, non-labored breathing ? ?Neuro: ?Mental Status: ?Patient is awake, alert, oriented to person, place, month, year, and situation. ?Patient's speech is clear and coherent ?No signs of aphasia or neglect ?Cranial Nerves: ?II: Visual Fields are full. Pupils are equal, round, and reactive to light.   ?III,IV, VI: EOMI without ptosis or diploplia.  ?V: Facial sensation is symmetric ?VII: Facial movement is symmetric resting and smiling ?VIII: Hearing is intact to voice ?X: palate elevates symmetrically ?XI: Shoulder shrug is symmetric. ?XII: Tongue protrudes midline ?Motor: ?Tone is normal. Bulk is normal. 5/5 strength was present in all four extremities.  ?Sensory: ?Sensation is symmetric to light touch and temperature in the arms and legs.  ? ? ?ASSESSMENT/PLAN ?Ms.  Bianca [name] is a 56 y.o. female with history of stage IV colon cancer, hematuria, anemia, subacute thyroiditis presenting with an episode of tunnel vision that occurred while driving. This episode  lasted approximately 30 minutes.  While in the ED her friend noticed that she was having difficulty with her speech. Tunnel vision and expressive aphasia have resolved and patient states that she feels relatively back

## 2021-08-22 NOTE — Plan of Care (Signed)
  Problem: Education: Goal: Knowledge of General Education information will improve Description: Including pain rating scale, medication(s)/side effects and non-pharmacologic comfort measures Outcome: Adequate for Discharge   

## 2021-08-22 NOTE — Progress Notes (Addendum)
EEG complete - results pending. ? ?Patient related that she had a tunnel vision event when tech  turned out the lights leaving out of the room. Nursing staff was called. ?

## 2021-08-22 NOTE — Code Documentation (Signed)
Stroke Response Nurse Documentation ?Code Documentation ? ?Bianca [name] is a 56 y.o. female arriving to [facility]  via [employer] on 4/6 with past medical hx of colon cancer. On No antithrombotic. Code stroke was activated by ED.  ? ?Patient from home where she was LKW at 2130 and now complaining of aphasia, confusion and visual changes.  ? ?Stroke team at the bedside on patient arrival. Labs drawn and patient cleared for CT by Dr. Langston Masker. Patient to CT with team. NIHSS 0, see documentation for details and code stroke times. Patient with no fixed deficits on exam. The following imaging was completed:  CT Head and MRI. Patient is not a candidate for IV Thrombolytic due to symptoms resolved. Patient is not not a candidate for IR due to No LVO.  ? ? ?Bedside handoff with ED RN Bryson Ha.   ? ?Madelynn Done  ?Rapid Response RN ? ? ?

## 2021-08-22 NOTE — H&P (Signed)
?History and Physical  ? ? ?Terry Abila XQJ:[id number] DOB: 1966-05-05 DOA: 08/21/2021 ? ?PCP: Teodoro Kil, PA-C  ?Patient coming from: Home. ? ?Chief Complaint: Tunnel vision. ? ?HPI: Bianca [name] is a 56 y.o. female with history of stage IV colon cancer follows up with [facility] was driving back from the beach when patient experienced tunnel vision in both eyes for almost half hour.  Patient was able to see things through a hollow.  Did not have any weakness of the extremities.  While waiting in the ER patient's friend noted that patient had some difficulty bringing out words for few minutes. ? ?ED Course: In the ER CT head followed by MRI brain with and without contrast and MRA brain were negative.  Neurologist on-call requested admission for further observation and also get EEG.  Labs are largely unremarkable.  EKG shows sinus rhythm. ? ?Review of Systems: As per HPI, rest all negative. ? ? ?Past Medical History:  ?Diagnosis Date  ? Allergy to alpha-gal   ? Thyroid disease   ? ? ?Past Surgical History:  ?Procedure Laterality Date  ? ADENOIDECTOMY    ? TONSILLECTOMY    ? ? ? reports that she has never smoked. She does not have any smokeless tobacco history on file. She reports that she does not drink alcohol and does not use drugs. ? ?Allergies  ?Allergen Reactions  ? Atropine Sulfate Other (See Comments)  ?  Low BP; numbness in both arms   ? Beef (Bovine) Protein Anaphylaxis  ? Justicia Adhatoda (Malabar Nut Tree) [Justicia Adhatoda] Hives  ? Lac Bovis Anaphylaxis  ? Sulfa Antibiotics Anaphylaxis  ? Bactrim [Sulfamethoxazole-Trimethoprim]   ? Milk-Related Compounds   ? Pork-Derived Products   ? Shellfish Allergy   ? ? ?Family History  ?Problem Relation Age of Onset  ? Parkinson's disease Father   ? ? ?Prior to Admission medications   ?Medication Sig Start Date End Date Taking? Authorizing Provider  ?albuterol (VENTOLIN HFA) 108 (90 Base) MCG/ACT inhaler Inhale 2 puffs into the lungs  every 4 (four) hours as needed for wheezing or shortness of breath. 03/06/20   Lyndal Pulley, DO  ?amoxicillin-clavulanate (AUGMENTIN) 875-125 MG per tablet Take 1 tablet by mouth 2 (two) times daily. Take with food 07/21/12   Kandra Nicolas, MD  ?beclomethasone (QVAR REDIHALER) 40 MCG/ACT inhaler Inhale 1 puff into the lungs 2 (two) times daily. 03/06/20   Lyndal Pulley, DO  ?benzonatate (TESSALON) 200 MG capsule Take 1 capsule (200 mg total) by mouth at bedtime. 07/21/12   Kandra Nicolas, MD  ?levothyroxine (SYNTHROID, LEVOTHROID) 25 MCG tablet Take 25 mcg by mouth daily.    [provider]  ?loperamide (IMODIUM A-D) 2 MG tablet     [provider]  ?midodrine (PROAMATINE) 2.5 MG tablet Take by mouth at bedtime. 02/19/20   [provider]  ?tiZANidine (ZANAFLEX) 2 MG tablet Take 1 tablet (2 mg total) by mouth at bedtime. 05/14/20   Lyndal Pulley, DO  ? ? ?Physical Exam: ?Constitutional: Moderately built and nourished. ?Vitals:  ? 08/21/21 2345 08/22/21 0000 08/22/21 0225 08/22/21 0300  ?BP: 121/68 115/68 106/68 102/70  ?Pulse: 60 65 71 (!) 58  ?Resp: '13 16 15 '$ (!) 27  ?Temp:      ?TempSrc:      ?SpO2: 98% 98% 100% 100%  ? ?Eyes: Anicteric no pallor. ?ENMT: No discharge from the ears eyes nose and mouth. ?Neck: No mass felt.  No neck rigidity. ?  Respiratory: No rhonchi or crepitations. ?Cardiovascular: S1-S2 heard. ?Abdomen: Soft nontender bowel sound present. ?Musculoskeletal: No edema. ?Skin: No rash. ?Neurologic: Alert awake oriented to time place and person.  Moving all extremities 5 x 5.  No facial asymmetry tongue is midline pupils are equal and reacting to light. ?Psychiatric: Appears normal.  Normal affect. ? ? ?Labs on Admission: I have personally reviewed following labs and imaging studies ? ?CBC: ?Recent Labs  ?Lab 08/21/21 ?2139 08/22/21 ?5427  ?WBC 9.1  --   ?NEUTROABS 5.5  --   ?HGB 12.7 13.3  ?HCT 39.4 39.0  ?MCV 97.8  --   ?PLT 207  --   ? ?Basic Metabolic  Panel: ?Recent Labs  ?Lab 08/21/21 ?2139 08/22/21 ?0623  ?NA 141 142  ?K 3.9 3.8  ?CL 109 106  ?CO2 27  --   ?GLUCOSE 91 91  ?BUN 20 24*  ?CREATININE 0.77 0.70  ?CALCIUM 9.4  --   ? ?GFR: ?CrCl cannot be calculated (Unknown ideal weight.). ?Liver Function Tests: ?No results for input(s): AST, ALT, ALKPHOS, BILITOT, PROT, ALBUMIN in the last 168 hours. ?No results for input(s): LIPASE, AMYLASE in the last 168 hours. ?No results for input(s): AMMONIA in the last 168 hours. ?Coagulation Profile: ?Recent Labs  ?Lab 08/21/21 ?2246  ?INR 1.0  ? ?Cardiac Enzymes: ?No results for input(s): CKTOTAL, CKMB, CKMBINDEX, TROPONINI in the last 168 hours. ?BNP (last 3 results) ?No results for input(s): PROBNP in the last 8760 hours. ?HbA1C: ?No results for input(s): HGBA1C in the last 72 hours. ?CBG: ?Recent Labs  ?Lab 08/21/21 ?2302  ?GLUCAP 107*  ? ?Lipid Profile: ?No results for input(s): CHOL, HDL, LDLCALC, TRIG, CHOLHDL, LDLDIRECT in the last 72 hours. ?Thyroid Function Tests: ?No results for input(s): TSH, T4TOTAL, FREET4, T3FREE, THYROIDAB in the last 72 hours. ?Anemia Panel: ?No results for input(s): VITAMINB12, FOLATE, FERRITIN, TIBC, IRON, RETICCTPCT in the last 72 hours. ?Urine analysis: ?   ?Component Value Date/Time  ? COLORURINE STRAW (A) 08/22/2021 0010  ? APPEARANCEUR CLEAR 08/22/2021 0010  ? LABSPEC 1.010 08/22/2021 0010  ? PHURINE 7.0 08/22/2021 0010  ? GLUCOSEU NEGATIVE 08/22/2021 0010  ? HGBUR MODERATE (A) 08/22/2021 0010  ? [place] NEGATIVE 08/22/2021 0010  ? [place] NEGATIVE 08/22/2021 0010  ? PROTEINUR NEGATIVE 08/22/2021 0010  ? NITRITE NEGATIVE 08/22/2021 0010  ? LEUKOCYTESUR NEGATIVE 08/22/2021 0010  ? ?Sepsis Labs: ?'@LABRCNTIP'$ (procalcitonin:4,lacticidven:4) ?) ?Recent Results (from the past 240 hour(s))  ?Resp Panel by RT-PCR (Flu A&B, Covid) Nasopharyngeal Swab     Status: None  ? Collection Time: 08/21/21 10:38 PM  ? Specimen: Nasopharyngeal Swab; Nasopharyngeal(NP) swabs in vial transport medium   ?Result Value Ref Range Status  ? SARS Coronavirus 2 by RT PCR NEGATIVE NEGATIVE Final  ?  Comment: (NOTE) ?SARS-CoV-2 target nucleic acids are NOT DETECTED. ? ?The SARS-CoV-2 RNA is generally detectable in upper respiratory ?specimens during the acute phase of infection. The lowest ?concentration of SARS-CoV-2 viral copies this assay can detect is ?138 copies/mL. A negative result does not preclude SARS-Cov-2 ?infection and should not be used as the sole basis for treatment or ?other patient management decisions. A negative result may occur with  ?improper specimen collection/handling, submission of specimen other ?than nasopharyngeal swab, presence of viral mutation(s) within the ?areas targeted by this assay, and inadequate number of viral ?copies(<138 copies/mL). A negative result must be combined with ?clinical observations, patient history, and epidemiological ?information. The expected result is Negative. ? ?Fact Sheet for Patients:  ?EntrepreneurPulse.com.au ? ?Fact Sheet for  Healthcare Providers:  ?IncredibleEmployment.be ? ?This test is no t yet approved or cleared by the [place] FDA and  ?has been authorized for detection and/or diagnosis of SARS-CoV-2 by ?FDA under an Emergency Use Authorization (EUA). This EUA will remain  ?in effect (meaning this test can be used) for the duration of the ?COVID-19 declaration under Section 564(b)(1) of the Act, 21 ?U.S.C.section 360bbb-3(b)(1), unless the authorization is terminated  ?or revoked sooner.  ? ? ?  ? Influenza A by PCR NEGATIVE NEGATIVE Final  ? Influenza B by PCR NEGATIVE NEGATIVE Final  ?  Comment: (NOTE) ?The Xpert Xpress SARS-CoV-2/FLU/RSV plus assay is intended as an aid ?in the diagnosis of influenza from Nasopharyngeal swab specimens and ?should not be used as a sole basis for treatment. Nasal washings and ?aspirates are unacceptable for Xpert Xpress SARS-CoV-2/FLU/RSV ?testing. ? ?Fact Sheet for  Patients: ?EntrepreneurPulse.com.au ? ?Fact Sheet for Healthcare Providers: ?IncredibleEmployment.be ? ?This test is not yet approved or cleared by the [place] FDA and ?has been a

## 2021-08-22 NOTE — Progress Notes (Signed)
?  Echocardiogram ?2D Echocardiogram has been performed. ? ?Bianca [name] ?08/22/2021, 11:28 AM ?

## 2021-08-22 NOTE — Progress Notes (Signed)
OT Cancellation Note ? ?Patient Details ?Name: Bianca [name] ?MRN: [id number] ?DOB: [birth year] ? ? ?Cancelled Treatment:    Reason Eval/Treat Not Completed: OT screened, no needs identified, will sign off. Pt back to baseline. Visual deficits resolved. Will sign off. Please reconsult if status changes.  ? ?[employer] ?Idara Woodside MSOT, OTR/L ?Acute Rehab ?Pager: [phone] ?Office: [phone] ?08/22/2021, 10:40 AM ?

## 2021-08-22 NOTE — Evaluation (Signed)
Physical Therapy Evaluation ?Patient Details ?Name: Bianca [name] ?MRN: [id number] ?DOB: 12-13-1965 ?Today's Date: 08/22/2021 ? ?History of Present Illness ? Pt is a 56 y/o female admitted secondary to tunnel vision. MRI negative. PMH includes colon cancer  ?Clinical Impression ? Patient evaluated by Physical Therapy with no further acute PT needs identified. All education has been completed and the patient has no further questions. Pt overall steady at a mod I to independent level with mobility tasks. No overt LOB noted. Reports friends can assist if needed. See below for any follow-up Physical Therapy or equipment needs. PT is signing off. Thank you for this referral. If needs change, please re-consult.  ?   ?   ? ?Recommendations for follow up therapy are one component of a multi-disciplinary discharge planning process, led by the attending physician.  Recommendations may be updated based on patient status, additional functional criteria and insurance authorization. ? ?Follow Up Recommendations No PT follow up ? ?  ?Assistance Recommended at Discharge PRN  ?Patient can return home with the following ?   ? ?  ?Equipment Recommendations None recommended by PT  ?Recommendations for Other Services ?    ?  ?Functional Status Assessment Patient has not had a recent decline in their functional status  ? ?  ?Precautions / Restrictions Precautions ?Precautions: None ?Restrictions ?Weight Bearing Restrictions: No  ? ?  ? ?Mobility ? Bed Mobility ?Overal bed mobility: Independent ?  ?  ?  ?  ?  ?  ?  ?  ? ?Transfers ?Overall transfer level: Independent ?  ?  ?  ?  ?  ?  ?  ?  ?  ?  ? ?Ambulation/Gait ?Ambulation/Gait assistance: Modified independent (Device/Increase time) ?Gait Distance (Feet): 120 Feet ?Assistive device: None ?Gait Pattern/deviations: Step-through pattern ?Gait velocity: mildly slower ?  ?  ?General Gait Details: Guarded gait, but no overt LOB noted with head turns. ? ?Stairs ?  ?  ?  ?  ?  ? ?Wheelchair  Mobility ?  ? ?Modified Rankin (Stroke Patients Only) ?  ? ?  ? ?Balance Overall balance assessment: No apparent balance deficits (not formally assessed) ?  ?  ?  ?  ?  ?  ?  ?  ?  ?  ?  ?  ?  ?  ?  ?  ?  ?  ?   ? ? ? ?Pertinent Vitals/Pain Pain Assessment ?Pain Assessment: No/denies pain  ? ? ?Home Living Family/patient expects to be discharged to:: Private residence ?Living Arrangements: Alone ?Available Help at Discharge: Friend(s) ?Type of Home: House ?Home Access: Stairs to enter ?  ?Entrance Stairs-Number of Steps: 3 ?Alternate Level Stairs-Number of Steps: flight ?Home Layout: Two level ?Home Equipment: None ?   ?  ?Prior Function Prior Level of Function : Independent/Modified Independent;Driving ?  ?  ?  ?  ?  ?  ?  ?  ?  ? ? ?Hand Dominance  ?   ? ?  ?Extremity/Trunk Assessment  ? Upper Extremity Assessment ?Upper Extremity Assessment: Overall WFL for tasks assessed ?  ? 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  ? ?Cervical / Trunk Assessment ?Cervical / Trunk Assessment: Normal  ?Communication  ? Communication: No difficulties  ?Cognition Arousal/Alertness: Awake/alert ?Behavior During Therapy: [facility] for tasks assessed/performed ?  Overall Cognitive Status: Within Functional Limits for tasks assessed ?  ?  ?  ?  ?  ?  ?  ?  ?  ?  ?  ?  ?  ?  ?  ?  ?  ?  ?  ? ?  ?General Comments   ? ?  ?Exercises    ? ?Assessment/Plan  ?  ?PT Assessment Patient does not need any further PT services  ?PT Problem List   ? ?   ?  ?PT Treatment Interventions     ? ?PT Goals (Current goals can be found in the Care Plan section)  ?Acute Rehab PT Goals ?Patient Stated Goal: to go home ?PT Goal Formulation: With patient ?Time For Goal  Achievement: 09/05/21 ?Potential to Achieve Goals: Good ? ?  ?Frequency   ?  ? ? ?Co-evaluation   ?  ?  ?  ?  ? ? ?  ?AM-PAC PT "6 Clicks" Mobility  ?Outcome Measure Help needed turning from your back to your side while in a flat bed without using bedrails?: None ?Help needed moving from lying on your back to sitting on the side of a flat bed without using bedrails?: None ?Help needed moving to and from a bed to a chair (including a wheelchair)?: None ?Help needed standing up from a chair using your arms (e.g., wheelchair or bedside chair)?: None ?Help needed to walk in hospital room?: None ?Help needed climbing 3-5 steps with a railing? : None ?6 Click Score: 24 ? ?  ?End of Session   ?Activity Tolerance: Patient tolerated treatment well ?Patient left: in bed;with call bell/phone within reach;with family/visitor present (on stretcher in ED) ?Nurse Communication: Mobility status ?PT Visit Diagnosis: Other symptoms and signs involving the nervous system (R29.898) ?  ? ?Time: 3419-3790 ?PT Time Calculation (min) (ACUTE ONLY): 10 min ? ? ?Charges:   PT Evaluation ?$PT Eval Low Complexity: 1 Low ?  ?  ?   ? ? ?Reuel Derby, PT, DPT  ?Acute Rehabilitation Services  ?Pager: [phone] ?Office: [phone] ? ? ?[place] ?08/22/2021, 10:39 AM ?

## 2021-08-22 NOTE — Procedures (Signed)
Patient Name: Bianca [name]  ?MRN: [id number]  ?Epilepsy Attending: Lora Havens  ?Referring Physician/Provider: Rise Patience, MD ?Date: 08/22/2021 ?Duration: 25.06 mins ? 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 EEG to evaluate for seizure ? ?Level of alertness: Awake, asleep ? ?AEDs during EEG study: None ? ?Technical aspects: This EEG study was done with scalp electrodes positioned according to the 10-20 International system of electrode placement. Electrical activity was acquired at a sampling rate of '500Hz'$  and reviewed with a high frequency filter of '70Hz'$  and a low frequency filter of '1Hz'$ . EEG data were recorded continuously and digitally stored.  ? ?Description: The posterior dominant rhythm consists of '10Hz'$  activity of moderate voltage (25-35 uV) seen predominantly in posterior head regions, symmetric and reactive to eye opening and eye closing. Sleep was characterized by vertex waves, sleep spindles (12 to 14 Hz), maximal frontocentral region. Patient was noted to have sharply contoured 8-'9hz'$  alpha rhythm during awake state with fluctuation in morphology but no definite evolution, lasted for about 19 seconds. Physiologic photic driving was not seen during photic stimulation.  Hyperventilation was not performed.    ? ?IMPRESSION: ?This study is within normal limits. No seizures or epileptiform discharges were seen throughout the recording. ? ?Lora Havens  ? ?

## 2021-08-23 LAB — BETA-2-GLYCOPROTEIN I ABS, IGG/M/A
Beta-2 Glyco I IgG: <9 GPI IgG units (ref 0–20)
Beta-2-Glycoprotein I IgA: <9 GPI IgA units (ref 0–25)
Beta-2-Glycoprotein I IgM: <9 GPI IgM units (ref 0–32)

## 2021-08-23 LAB — CARDIOLIPIN ANTIBODIES, IGG, IGM, IGA
Anticardiolipin IgA: <9 APL U/mL (ref 0–11)
Anticardiolipin IgG: <9 GPL U/mL (ref 0–14)
Anticardiolipin IgM: <9 MPL U/mL (ref 0–12)

## 2021-08-24 LAB — LUPUS ANTICOAGULANT PANEL
DRVVT: 36.3 s (ref 0.0–47.0)
PTT Lupus Anticoagulant: 22 s (ref 0.0–43.5)

## 2021-08-24 LAB — PROTEIN S, TOTAL: Protein S Ag, Total: 100 % (ref 60–150)

## 2021-08-24 LAB — PROTEIN S ACTIVITY: Protein S Activity: 99 % (ref 63–140)

## 2021-08-24 LAB — HOMOCYSTEINE: Homocysteine: 10.5 umol/L (ref 0.0–14.5)

## 2021-08-24 LAB — PROTEIN C ACTIVITY: Protein C Activity: 96 % (ref 73–180)

## 2021-08-25 LAB — PROTEIN C, TOTAL: Protein C, Total: 103 % (ref 60–150)

## 2021-08-27 LAB — FACTOR 5 LEIDEN

## 2021-08-28 ENCOUNTER — Ambulatory Visit: Payer: Medicare Other | Admitting: Family Medicine

## 2021-09-01 LAB — PROTHROMBIN GENE MUTATION

## 2021-09-03 NOTE — Progress Notes (Deleted)
[facility] [address] [place] Phone: [phone] Subjective:    I'm seeing this patient by the request  of:  Teodoro Kil, PA-C  CC: multiple complaints   [id number]  07/31/2021 Patient is being seen by other providers.  We will get ultrasound of kidneys with patient recently having a regular cystoscopy.  Patient's most recent urinalysis continues to have hematuria but no signs of any infectious etiology.  Patient continues to have a current pack pain.  Is having some increasing in CVA tenderness and with now patient's hematuria that she continues to be seen by other providers patient was wondering if we could do any other imaging.  We discussed and will try ultrasound of the kidneys to further evaluate if there is any potential kidney stone that could be contributing.  Patient's most recent CT scans were reviewed and did not show any type of masses or anything that was too concerning at that time.  May need to consider repeating imaging at some point or a CT for a kidney stone protocol.  Patient was seen by urology recently and states that the cystoscopy was normal.  Attempted osteopathic manipulation again at this time.  Encouraged her to continue to stay active where she can.  Patient's other laboratory work-up that was revealed also today and appears to be fairly unremarkable.  Follow-up with me again in 4 to 6 weeks.  Total time with patient greater than 32 minutes  Updated 09/04/2021 Bianca [name] is a 56 y.o. female coming in with complaint of back pain  Since we have seen patient patient did have an episode of tunnel vision that did allow patient to be admitted and worked up for neurologic aspect.  Reviewing all the work-up unremarkable true vascular compromise and diagnosed with most likely exhaustion versus ocular migraine.     Past Medical History:  Diagnosis Date   Allergy to alpha-gal    Thyroid disease    Past  Surgical History:  Procedure Laterality Date   ADENOIDECTOMY     TONSILLECTOMY     Social History   Socioeconomic History   Marital status: Single    Spouse name: Not on file   Number of children: Not on file   Years of education: Not on file   Highest education level: Not on file  Occupational History   Not on file  Tobacco Use   Smoking status: Never   Smokeless tobacco: Not on file  Substance and Sexual Activity   Alcohol use: No   Drug use: No   Sexual activity: Not on file  Other Topics Concern   Not on file  Social History Narrative   Not on file   Social Determinants of Health   Financial Resource Strain: Not on file  Food Insecurity: Not on file  Transportation Needs: Not on file  Physical Activity: Not on file  Stress: Not on file  Social Connections: Not on file   Allergies  Allergen Reactions   Atropine Sulfate Other (See Comments)    Low BP; numbness in both arms    Beef (Bovine) Protein Anaphylaxis    Alpha-gal syndrome    Beef-Derived Products Anaphylaxis    Alpha-gal syndrome    Gelatin Anaphylaxis    Alpha-gal syndrome    Heparin Anaphylaxis    Porcine heparin - Alpha-gal syndrome     Heparin (Bovine) Anaphylaxis    Alpha-gal syndrome   Lac Bovis Anaphylaxis    Alpha-gal syndrome  Milk-Related Compounds Anaphylaxis    Alpha-gal syndrome    Pork-Derived Products Anaphylaxis    Alpha-gal syndrome    Sulfa Antibiotics Anaphylaxis   Prochlorperazine     Lowered BP; fainted    Shellfish Allergy Hives    Had reaction once to shrimp   Family History  Problem Relation Age of Onset   Parkinson's disease Father     Current Outpatient Medications (Endocrine & Metabolic):    levothyroxine (SYNTHROID) 137 MCG tablet, Take 68.5 mcg by mouth daily.  Current Outpatient Medications (Cardiovascular):    EPINEPHrine 0.3 mg/0.3 mL IJ SOAJ injection, Inject 0.3 mg into the muscle once as needed for anaphylaxis.  Current Outpatient  Medications (Respiratory):    beclomethasone (QVAR REDIHALER) 40 MCG/ACT inhaler, Inhale 1 puff into the lungs 2 (two) times daily. (Patient not taking: Reported on 08/22/2021)  Current Outpatient Medications (Analgesics):    aspirin EC 81 MG tablet, Take 1 tablet (81 mg total) by mouth daily. Swallow whole.   Current Outpatient Medications (Other):    bismuth subsalicylate (PEPTO BISMOL) 262 MG/15ML suspension, Take 30 mLs by mouth daily as needed for indigestion or diarrhea or loose stools.   Reviewed prior external information including notes and imaging from  primary care provider As well as notes that were available from care everywhere and other healthcare systems.  Past medical history, social, surgical and family history all reviewed in electronic medical record.  No pertanent information unless stated regarding to the chief complaint.   Review of Systems:  No headache, visual changes, nausea, vomiting, diarrhea, constipation, dizziness, abdominal pain, skin rash, fevers, chills, night sweats, weight loss, swollen lymph nodes, body aches, joint swelling, chest pain, shortness of breath, mood changes. POSITIVE muscle aches  Objective  There were no vitals taken for this visit.   General: No apparent distress alert and oriented x3 mood and affect normal, dressed appropriately.  HEENT: Pupils equal, extraocular movements intact  Respiratory: Patient's speak in full sentences and does not appear short of breath  Cardiovascular: No lower extremity edema, non tender, no erythema  Gait normal with good balance and coordination.  MSK:      Impression and Recommendations:     The above documentation has been reviewed and is accurate and complete Lyndal Pulley, DO

## 2021-09-04 ENCOUNTER — Ambulatory Visit: Payer: Medicare Other | Admitting: Family Medicine

## 2021-09-04 NOTE — Progress Notes (Signed)
?   Bianca [name] D.Merril Abbe ?[facility] ?[place] ?Phone: [phone] ?  ?Assessment and Plan:   ?  ?1. Chronic bilateral low back pain without sciatica ?2. Thoracic spine pain ?3. Somatic dysfunction of cervical region ?4. Somatic dysfunction of thoracic region ?5. Somatic dysfunction of lumbar region ?6. Somatic dysfunction of pelvic region ?7. Somatic dysfunction of rib region ?-Chronic with exacerbation, subsequent visit ?- Recurrence of multiple musculoskeletal complaints with most prominent being in neck, upper back, low back without red flag symptoms ?- Patient has received significant relief with OMT in the past.  Elects for repeat OMT today.  Tolerated well per note below. ?- Decision today to treat with OMT was based on Physical Exam ?  ?After verbal consent patient was treated with HVLA (high velocity low amplitude), ME (muscle energy), FPR (flex positional release), ST (soft tissue), PC/PD (Pelvic Compression/ Pelvic Decompression) techniques in cervical, rib, thoracic, lumbar, and pelvic areas. Patient tolerated the procedure well with improvement in symptoms.  Patient educated on potential side effects of soreness and recommended to rest, hydrate, and use Tylenol as needed for pain control. ?  ?Pertinent previous records reviewed include none ?  ?Follow Up: 3 weeks for repeat OMT maintenance.  Follow-up already scheduled before this visit ?  ?Subjective:   ?I, [name], am serving as a Education administrator for Doctor [employer] ? ?Chief Complaint: OMT follow up  ? ?HPI:  ?07/03/2021 ?Bianca [name] is a 56 y.o. female coming in with complaint of back and neck pain. OMT on 05/08/2021.  Patient has had 1 episode of enteritis since we have seen patient.  Having difficulty psychosocial stressors secondary to her comorbidities.  Patient states L ribs are painful. Feels like she has some neuropathy in feet as they are contracting in the mornings. ? ?09/05/2021 ?Patient  states she has thoracic tightness needs a tune up to loosen up the rib cage her chest has been popping a little  ? ?Relevant Historical Information: History of metastatic colon cancer to liver ? ?Additional pertinent review of systems negative. ? ?Current Outpatient Medications  ?Medication Sig Dispense Refill  ? aspirin EC 81 MG tablet Take 1 tablet (81 mg total) by mouth daily. Swallow whole. 30 tablet 11  ? beclomethasone (QVAR REDIHALER) 40 MCG/ACT inhaler Inhale 1 puff into the lungs 2 (two) times daily. 1 each 0  ? bismuth subsalicylate (PEPTO BISMOL) 262 MG/15ML suspension Take 30 mLs by mouth daily as needed for indigestion or diarrhea or loose stools.    ? EPINEPHrine 0.3 mg/0.3 mL IJ SOAJ injection Inject 0.3 mg into the muscle once as needed for anaphylaxis.    ? levothyroxine (SYNTHROID) 137 MCG tablet Take 68.5 mcg by mouth daily.    ? ?No current facility-administered medications for this visit.  ?  ?  ?Objective:   ?  ?Vitals:  ? 09/05/21 1113  ?BP: 110/80  ?Pulse: 75  ?SpO2: 93%  ?Weight: 159 lb (72.1 kg)  ?Height: '5\' 7"'$  (1.702 m)  ?  ?  ?Body mass index is 24.9 kg/m?.  ?  ?Physical Exam:   ?  ?General: Well-appearing, cooperative, sitting comfortably in no acute distress.  ? ?OMT Physical Exam: ? ?ASIS Compression Test: Positive Right ?Cervical: TTP paraspinal, C3 RLSR's ?Rib: Bilateral elevated first rib with TTP ?Thoracic: TTP paraspinal, T3-6 RRSL ?Lumbar: TTP paraspinal, L2 RLSL ?Pelvis: Right anterior innominate ? ?Electronically signed by:  ?Bianca [name] D.Merril Abbe ?[facility] ?11:37 AM 09/05/21 ?

## 2021-09-05 ENCOUNTER — Ambulatory Visit (INDEPENDENT_AMBULATORY_CARE_PROVIDER_SITE_OTHER): Payer: Medicare Other | Admitting: Sports Medicine

## 2021-09-05 VITALS — BP 110/80 | HR 75 | Ht 67.0 in | Wt 159.0 lb

## 2021-09-05 DIAGNOSIS — M546 Pain in thoracic spine: Secondary | ICD-10-CM

## 2021-09-05 DIAGNOSIS — M545 Low back pain, unspecified: Secondary | ICD-10-CM

## 2021-09-05 DIAGNOSIS — M9903 Segmental and somatic dysfunction of lumbar region: Secondary | ICD-10-CM | POA: Diagnosis not present

## 2021-09-05 DIAGNOSIS — M9905 Segmental and somatic dysfunction of pelvic region: Secondary | ICD-10-CM

## 2021-09-05 DIAGNOSIS — M9901 Segmental and somatic dysfunction of cervical region: Secondary | ICD-10-CM

## 2021-09-05 DIAGNOSIS — M9902 Segmental and somatic dysfunction of thoracic region: Secondary | ICD-10-CM

## 2021-09-05 DIAGNOSIS — M9908 Segmental and somatic dysfunction of rib cage: Secondary | ICD-10-CM

## 2021-09-05 DIAGNOSIS — G8929 Other chronic pain: Secondary | ICD-10-CM | POA: Diagnosis not present

## 2021-09-05 NOTE — Patient Instructions (Signed)
Good to see you   

## 2021-09-16 NOTE — Progress Notes (Deleted)
   Bianca [name] D.Bianca [name] Phone: [phone]   Assessment and Plan:     There are no diagnoses linked to this encounter.  *** - Patient has received significant relief with OMT in the past.  Elects for repeat OMT today.  Tolerated well per note below. - Decision today to treat with OMT was based on Physical Exam   After verbal consent patient was treated with HVLA (high velocity low amplitude), ME (muscle energy), FPR (flex positional release), ST (soft tissue), PC/PD (Pelvic Compression/ Pelvic Decompression) techniques in cervical, rib, thoracic, lumbar, and pelvic areas. Patient tolerated the procedure well with improvement in symptoms.  Patient educated on potential side effects of soreness and recommended to rest, hydrate, and use Tylenol as needed for pain control.   Pertinent previous records reviewed include ***   Follow Up: ***     Subjective:   I, Benjamyn [name], am serving as a Education administrator for Bianca [name]  Chief Complaint: OMT follow up    HPI:  07/03/2021 Bianca [name] is a 56 y.o. female coming in with complaint of back and neck pain. OMT on 05/08/2021.  Patient has had 1 episode of enteritis since we have seen patient.  Having difficulty psychosocial stressors secondary to her comorbidities.  Patient states L ribs are painful. Feels like she has some neuropathy in feet as they are contracting in the mornings.   09/05/2021 Patient states she has thoracic tightness needs a tune up to loosen up the rib cage her chest has been popping a little   09/18/2021 Patient states   Relevant Historical Information: History of metastatic colon cancer to liver  Additional pertinent review of systems negative.  Current Outpatient Medications  Medication Sig Dispense Refill   aspirin EC 81 MG tablet Take 1 tablet (81 mg total) by mouth daily. Swallow whole. 30 tablet 11   beclomethasone (QVAR REDIHALER) 40  MCG/ACT inhaler Inhale 1 puff into the lungs 2 (two) times daily. 1 each 0   bismuth subsalicylate (PEPTO BISMOL) 262 MG/15ML suspension Take 30 mLs by mouth daily as needed for indigestion or diarrhea or loose stools.     EPINEPHrine 0.3 mg/0.3 mL IJ SOAJ injection Inject 0.3 mg into the muscle once as needed for anaphylaxis.     levothyroxine (SYNTHROID) 137 MCG tablet Take 68.5 mcg by mouth daily.     No current facility-administered medications for this visit.      Objective:     There were no vitals filed for this visit.    There is no height or weight on file to calculate BMI.    Physical Exam:     General: Well-appearing, cooperative, sitting comfortably in no acute distress.   OMT Physical Exam:  ASIS Compression Test: Positive Right Cervical: TTP paraspinal, *** Rib: Bilateral elevated first rib with TTP Thoracic: TTP paraspinal,*** Lumbar: TTP paraspinal,*** Pelvis: Right anterior innominate  Electronically signed by:  Bianca [name] D.[name] 8:04 AM 09/16/21

## 2021-09-18 ENCOUNTER — Ambulatory Visit: Payer: Medicare Other | Admitting: Sports Medicine

## 2021-09-24 NOTE — Progress Notes (Signed)
?Charlann Boxer D.O. ?[facility] ?[place] ?Phone: [phone] ?Subjective:   ?I, [name], am serving as a scribe for Dr. Hulan Saas. ? ?This visit occurred during the SARS-CoV-2 public health emergency.  Safety protocols were in place, including screening questions prior to the visit, additional usage of staff PPE, and extensive cleaning of exam room while observing appropriate contact time as indicated for disinfecting solutions.  ? ? ?I'm seeing this patient by the request  of:  Teodoro Kil, PA-C ? ?CC: Back pain follow-up ? ?[id number]  ?Bianca [name] is a 56 y.o. female coming in with complaint of back and neck pain. OMT on 09/05/2021 with Dr. Glennon Mac. Patient states that she feels like back is slipping out of place. Has not had spasms for a week or two but has had flares in past 3 days.  Patient at last exam was having difficulty with urination and hematuria.  We did get an ultrasound of the kidneys that did show a cyst noted.  Patient was seen by her oncologist and they believe it is more of a simple cyst.  Patient's symptoms have resolved from this.  Continue to have back pain ? ?Medications patient has been prescribed: None ? ?Taking: ? ? ?  ? ? ? ? ?Reviewed prior external information including notes and imaging from previsou exam, outside providers and external EMR if available.  ? ?As well as notes that were available from care everywhere and other healthcare systems. ? ?Past medical history, social, surgical and family history all reviewed in electronic medical record.  No pertanent information unless stated regarding to the chief complaint.  ? ?Past Medical History:  ?Diagnosis Date  ? Allergy to alpha-gal   ? Thyroid disease   ?  ?Allergies  ?Allergen Reactions  ? Atropine Sulfate Other (See Comments)  ?  Low BP; numbness in both arms   ? Beef (Bovine) Protein Anaphylaxis  ?  Alpha-gal syndrome ?  ? Beef-Derived Products Anaphylaxis  ?   Alpha-gal syndrome ?  ? Gelatin Anaphylaxis  ?  Alpha-gal syndrome ?  ? Heparin Anaphylaxis  ?  Porcine heparin - Alpha-gal syndrome ? ?  ? Heparin (Bovine) Anaphylaxis  ?  Alpha-gal syndrome  ? Lac Bovis Anaphylaxis  ?  Alpha-gal syndrome ?  ? Milk-Related Compounds Anaphylaxis  ?  Alpha-gal syndrome ?  ? Pork-Derived Products Anaphylaxis  ?  Alpha-gal syndrome ?  ? Sulfa Antibiotics Anaphylaxis  ? Prochlorperazine   ?  Lowered BP; fainted ?  ? Shellfish Allergy Hives  ?  Had reaction once to shrimp  ? ? ? ?Review of Systems: ? No headache, visual changes, nausea, vomiting, diarrhea, constipation, dizziness, abdominal pain, skin rash, fevers, chills, night sweats, weight loss, swollen lymph nodes, body aches, joint swelling, chest pain, shortness of breath, mood changes. POSITIVE muscle aches ? ?Objective  ?Blood pressure (!) 102/58, pulse 75, height '5\' 7"'$  (1.702 m), weight 158 lb (71.7 kg), SpO2 99 %. ?  ?General: No apparent distress alert and oriented x3 mood and affect normal, dressed appropriately.  ?HEENT: Pupils equal, extraocular movements intact  ?Respiratory: Patient's speak in full sentences and does not appear short of breath  ?Cardiovascular: No lower extremity edema, non tender, no erythema  ?Low back exam does have some loss of lordosis.  Some tenderness to palpation in the paraspinal musculature.  Patient does have tenderness in the thoracolumbar juncture as well. ? ?Osteopathic findings ? ?C2 flexed rotated and side bent right ?  C6 flexed rotated and side bent left ?T3 extended rotated and side bent right inhaled rib ?T7 extended rotated and side bent left ?L2 flexed rotated and side bent right ?Sacrum right on right ? ? ? ? ?  ?Assessment and Plan: ? ?Back pain ?Patient does have chronic discomfort but has made some progress.  Patient now seems to be doing better with the diet and I think that is contributing to some improvement in some of the pain.  Encouraged her to continue to stay active and  continue to do the stretching when possible.  Responding well to manipulation.Total time with patient as well as reviewing outside records and imaging 36 minutes  ? ?Nonallopathic problems ? ?Decision today to treat with OMT was based on Physical Exam ? ?After verbal consent patient was treated with  ME, FPR techniques in cervical, rib, thoracic, lumbar, and sacral  areas ? ?Patient tolerated the procedure well with improvement in symptoms ? ?Patient given exercises, stretches and lifestyle modifications ? ?See medications in patient instructions if given ? ?Patient will follow up in 4-8 weeks ? ?  ? ? ?The above documentation has been reviewed and is accurate and complete Lyndal Pulley, DO ? ? ?  ? ? Note: This dictation was prepared with Dragon dictation along with smaller phrase technology. Any transcriptional errors that result from this process are unintentional.    ?  ?  ? ?

## 2021-09-25 ENCOUNTER — Ambulatory Visit (INDEPENDENT_AMBULATORY_CARE_PROVIDER_SITE_OTHER): Payer: Medicare Other | Admitting: Family Medicine

## 2021-09-25 VITALS — BP 102/58 | HR 75 | Ht 67.0 in | Wt 158.0 lb

## 2021-09-25 DIAGNOSIS — G8929 Other chronic pain: Secondary | ICD-10-CM | POA: Diagnosis not present

## 2021-09-25 DIAGNOSIS — M9902 Segmental and somatic dysfunction of thoracic region: Secondary | ICD-10-CM

## 2021-09-25 DIAGNOSIS — M545 Low back pain, unspecified: Secondary | ICD-10-CM | POA: Diagnosis not present

## 2021-09-25 DIAGNOSIS — M9908 Segmental and somatic dysfunction of rib cage: Secondary | ICD-10-CM | POA: Diagnosis not present

## 2021-09-25 DIAGNOSIS — M9901 Segmental and somatic dysfunction of cervical region: Secondary | ICD-10-CM | POA: Diagnosis not present

## 2021-09-25 DIAGNOSIS — M9903 Segmental and somatic dysfunction of lumbar region: Secondary | ICD-10-CM | POA: Diagnosis not present

## 2021-09-25 DIAGNOSIS — M9904 Segmental and somatic dysfunction of sacral region: Secondary | ICD-10-CM

## 2021-09-25 NOTE — Patient Instructions (Signed)
Good to see you ?Glad everything turned out great ?Thanks for ideas for vintage ?Can look if I have something available before but otherwise  ?See me in 6-8 weeks ?

## 2021-09-27 NOTE — Assessment & Plan Note (Signed)
Patient does have chronic discomfort but has made some progress.  Patient now seems to be doing better with the diet and I think that is contributing to some improvement in some of the pain.  Encouraged her to continue to stay active and continue to do the stretching when possible.  Responding well to manipulation.Total time with patient as well as reviewing outside records and imaging 36 minutes ?

## 2021-10-15 NOTE — Progress Notes (Unsigned)
   Bianca [name] D.[place] Phone: [phone]   Assessment and Plan:     There are no diagnoses linked to this encounter.  *** - Patient has received significant relief with OMT in the past.  Elects for repeat OMT today.  Tolerated well per note below. - Decision today to treat with OMT was based on Physical Exam   After verbal consent patient was treated with HVLA (high velocity low amplitude), ME (muscle energy), FPR (flex positional release), ST (soft tissue), PC/PD (Pelvic Compression/ Pelvic Decompression) techniques in cervical, rib, thoracic, lumbar, and pelvic areas. Patient tolerated the procedure well with improvement in symptoms.  Patient educated on potential side effects of soreness and recommended to rest, hydrate, and use Tylenol as needed for pain control.   Pertinent previous records reviewed include ***   Follow Up: ***     Subjective:   I, Chandra Feger, am serving as a Education administrator for Doctor Glennon Mac  Chief Complaint: OMT follow up   HPI:   09/25/2021  Bianca [name] is a 56 y.o. female coming in with complaint of back and neck pain. OMT on 09/05/2021 with Dr. Glennon Mac. Patient states that she feels like back is slipping out of place. Has not had spasms for a week or two but has had flares in past 3 days.  Patient at last exam was having difficulty with urination and hematuria.  We did get an ultrasound of the kidneys that did show a cyst noted.  Patient was seen by her oncologist and they believe it is more of a simple cyst.  Patient's symptoms have resolved from this.  Continue to have back pain  10/16/2021 Patient states  Relevant Historical Information: ***  Additional pertinent review of systems negative.  Current Outpatient Medications  Medication Sig Dispense Refill   aspirin EC 81 MG tablet Take 1 tablet (81 mg total) by mouth daily. Swallow whole. 30 tablet 11   beclomethasone (QVAR  REDIHALER) 40 MCG/ACT inhaler Inhale 1 puff into the lungs 2 (two) times daily. 1 each 0   bismuth subsalicylate (PEPTO BISMOL) 262 MG/15ML suspension Take 30 mLs by mouth daily as needed for indigestion or diarrhea or loose stools.     EPINEPHrine 0.3 mg/0.3 mL IJ SOAJ injection Inject 0.3 mg into the muscle once as needed for anaphylaxis.     levothyroxine (SYNTHROID) 137 MCG tablet Take 68.5 mcg by mouth daily.     No current facility-administered medications for this visit.      Objective:     There were no vitals filed for this visit.    There is no height or weight on file to calculate BMI.    Physical Exam:     General: Well-appearing, cooperative, sitting comfortably in no acute distress.   OMT Physical Exam:  ASIS Compression Test: Positive Right Cervical: TTP paraspinal, *** Rib: Bilateral elevated first rib with TTP Thoracic: TTP paraspinal,*** Lumbar: TTP paraspinal,*** Pelvis: Right anterior innominate  Electronically signed by:  Bianca [name] D.[name] 11:36 AM 10/15/21

## 2021-10-16 ENCOUNTER — Ambulatory Visit (INDEPENDENT_AMBULATORY_CARE_PROVIDER_SITE_OTHER): Payer: Medicare Other | Admitting: Sports Medicine

## 2021-10-16 VITALS — BP 126/78 | HR 73 | Ht 67.0 in | Wt 158.0 lb

## 2021-10-16 DIAGNOSIS — M9905 Segmental and somatic dysfunction of pelvic region: Secondary | ICD-10-CM

## 2021-10-16 DIAGNOSIS — M546 Pain in thoracic spine: Secondary | ICD-10-CM | POA: Diagnosis not present

## 2021-10-16 DIAGNOSIS — M9902 Segmental and somatic dysfunction of thoracic region: Secondary | ICD-10-CM

## 2021-10-16 DIAGNOSIS — M9904 Segmental and somatic dysfunction of sacral region: Secondary | ICD-10-CM

## 2021-10-16 DIAGNOSIS — M545 Low back pain, unspecified: Secondary | ICD-10-CM

## 2021-10-16 DIAGNOSIS — M9903 Segmental and somatic dysfunction of lumbar region: Secondary | ICD-10-CM

## 2021-10-16 DIAGNOSIS — G8929 Other chronic pain: Secondary | ICD-10-CM

## 2021-10-16 DIAGNOSIS — M9908 Segmental and somatic dysfunction of rib cage: Secondary | ICD-10-CM

## 2021-10-16 DIAGNOSIS — M9901 Segmental and somatic dysfunction of cervical region: Secondary | ICD-10-CM | POA: Diagnosis not present

## 2021-10-16 NOTE — Patient Instructions (Signed)
Good to see you   

## 2021-10-23 NOTE — Progress Notes (Unsigned)
[facility] [place] [address] [place] Phone: [phone] Subjective:   [name], am serving as a scribe for Dr. Hulan Saas.  I'm seeing this patient by the request  of:  Teodoro Kil, PA-C  CC: back and neck pain   [id number]  Bianca [name] is a 56 y.o. female coming in with complaint of back and neck pain. OMT on 10/16/2021 with Dr. Glennon Mac. Patient states same per usual. No new complaints.  Medications patient has been prescribed: None  Taking:         Reviewed prior external information including notes and imaging from previsou exam, outside providers and external EMR if available.   As well as notes that were available from care everywhere and other healthcare systems. Continues to see primary care, behavioral health on a regular basis last imaging in 3/24 all normal on the CT scan as well for evaluation after hepatic cancer   Past medical history, social, surgical and family history all reviewed in electronic medical record.  No pertanent information unless stated regarding to the chief complaint.   Past Medical History:  Diagnosis Date   Allergy to alpha-gal    Thyroid disease     Allergies  Allergen Reactions   Atropine Sulfate Other (See Comments)    Low BP; numbness in both arms    Beef (Bovine) Protein Anaphylaxis    Alpha-gal syndrome    Beef-Derived Products Anaphylaxis    Alpha-gal syndrome    Gelatin Anaphylaxis    Alpha-gal syndrome    Heparin Anaphylaxis    Porcine heparin - Alpha-gal syndrome     Heparin (Bovine) Anaphylaxis    Alpha-gal syndrome   Lac Bovis Anaphylaxis    Alpha-gal syndrome    Milk-Related Compounds Anaphylaxis    Alpha-gal syndrome    Pork-Derived Products Anaphylaxis    Alpha-gal syndrome    Sulfa Antibiotics Anaphylaxis   Prochlorperazine     Lowered BP; fainted    Shellfish Allergy Hives    Had reaction once to shrimp     Review of Systems:  No  headache, visual changes, nausea, vomiting, diarrhea, constipation, dizziness, abdominal pain, skin rash, fevers, chills, night sweats, weight loss, swollen lymph nodes, joint swelling, chest pain, shortness of breath, mood changes. POSITIVE muscle aches, body aches  Objective  Blood pressure (!) 98/58, pulse 88, height '5\' 7"'$  (1.702 m), weight 157 lb (71.2 kg), SpO2 98 %.   General: No apparent distress alert and oriented x3 mood and affect normal, dressed appropriately.  HEENT: Pupils equal, extraocular movements intact  Respiratory: Patient's speak in full sentences and does not appear short of breath  Cardiovascular: No lower extremity edema, non tender, no erythema  Patient is tightness noted in the parascapular region in the thoracic spine bilaterally. Patient's neck does have some limited sidebending.  Patient has improved core strength but still has some hip abductor weakness noted.  Osteopathic findings  C3 flexed rotated and side bent right C6 flexed rotated and side bent left T5 extended rotated and side bent right inhaled rib T7 extended rotated and side bent left L2 flexed rotated and side bent right Sacrum right on right       Assessment and Plan:  Back pain   Exam does have some loss of lordosis.  Continues to have some discomfort and pain.  Discussed with patient about icing regimen and home exercises.  No change in medications at the moment.  Does seem to be responding to  manipulation and follow-up again in 4 to 6 weeks    Nonallopathic problems  Decision today to treat with OMT was based on Physical Exam  After verbal consent patient was treated with HVLA, ME, FPR techniques in cervical, rib, thoracic, lumbar, and sacral  areas  Patient tolerated the procedure well with improvement in symptoms  Patient given exercises, stretches and lifestyle modifications  See medications in patient instructions if given  Patient will follow up in 4-8 weeks      The  above documentation has been reviewed and is accurate and complete Lyndal Pulley, DO        Note: This dictation was prepared with Dragon dictation along with smaller phrase technology. Any transcriptional errors that result from this process are unintentional.

## 2021-10-28 ENCOUNTER — Ambulatory Visit (INDEPENDENT_AMBULATORY_CARE_PROVIDER_SITE_OTHER): Payer: Medicare Other | Admitting: Family Medicine

## 2021-10-28 VITALS — BP 98/58 | HR 88 | Ht 67.0 in | Wt 157.0 lb

## 2021-10-28 DIAGNOSIS — M9904 Segmental and somatic dysfunction of sacral region: Secondary | ICD-10-CM | POA: Diagnosis not present

## 2021-10-28 DIAGNOSIS — M9902 Segmental and somatic dysfunction of thoracic region: Secondary | ICD-10-CM

## 2021-10-28 DIAGNOSIS — M9903 Segmental and somatic dysfunction of lumbar region: Secondary | ICD-10-CM

## 2021-10-28 DIAGNOSIS — G8929 Other chronic pain: Secondary | ICD-10-CM | POA: Diagnosis not present

## 2021-10-28 DIAGNOSIS — M545 Low back pain, unspecified: Secondary | ICD-10-CM | POA: Diagnosis not present

## 2021-10-28 DIAGNOSIS — M9908 Segmental and somatic dysfunction of rib cage: Secondary | ICD-10-CM | POA: Diagnosis not present

## 2021-10-28 DIAGNOSIS — M9901 Segmental and somatic dysfunction of cervical region: Secondary | ICD-10-CM | POA: Diagnosis not present

## 2021-10-28 NOTE — Assessment & Plan Note (Addendum)
   Exam does have some loss of lordosis.  Continues to have some discomfort and pain.  Discussed with patient about icing regimen and home exercises.  No change in medications at the moment.  Does seem to be responding to manipulation and follow-up again in 4 to 6 weeks Total time reviewing patient's outside imaging, notes and discussing with patient 36 minutes

## 2021-10-28 NOTE — Patient Instructions (Signed)
Good to see you! Thanks for the invite You've made such great strides See you again in 4-6 weeks

## 2021-11-21 NOTE — Progress Notes (Unsigned)
[place] Phone: [phone] Subjective:   [name], am serving as a scribe for Dr. Hulan Saas.   I'm seeing this patient by the request  of:  Teodoro Kil, PA-C  CC: Back and neck pain  [id number]  Bianca [name] is a 56 y.o. female coming in with complaint of back and neck pain. OMT on 10/28/2021. Patient states that she is doing somewhat better. Was in hospital since we last saw her. Does have numbness in L hand with certain thoracic spine movements.   Medications patient has been prescribed: None  Taking:         Reviewed prior external information including notes and imaging from previsou exam, outside providers and external EMR if available.   As well as notes that were available from care everywhere and other healthcare systems.  Reviewed patient's most recent admission in the hospital for what appears to be more dehydration from enteritis.  Past medical history, social, surgical and family history all reviewed in electronic medical record.  No pertanent information unless stated regarding to the chief complaint.   Past Medical History:  Diagnosis Date   Allergy to alpha-gal    Thyroid disease     Allergies  Allergen Reactions   Atropine Sulfate Other (See Comments)    Low BP; numbness in both arms    Beef (Bovine) Protein Anaphylaxis    Alpha-gal syndrome    Beef-Derived Products Anaphylaxis    Alpha-gal syndrome    Gelatin Anaphylaxis    Alpha-gal syndrome    Heparin Anaphylaxis    Porcine heparin - Alpha-gal syndrome     Heparin (Bovine) Anaphylaxis    Alpha-gal syndrome   Lac Bovis Anaphylaxis    Alpha-gal syndrome    Milk-Related Compounds Anaphylaxis    Alpha-gal syndrome    Pork-Derived Products Anaphylaxis    Alpha-gal syndrome    Sulfa Antibiotics Anaphylaxis   Prochlorperazine     Lowered BP; fainted    Shellfish Allergy Hives    Had reaction  once to shrimp     Review of Systems:  No headache, visual changes, nausea, vomiting, diarrhea, constipation, dizziness, abdominal pain, skin rash, fevers, chills, night sweats, weight loss, swollen lymph nodes,  joint swelling, chest pain, shortness of breath, mood changes. POSITIVE muscle aches, body aches  Objective  Blood pressure 102/62, pulse 68, height '5\' 7"'$  (1.702 m), weight 157 lb (71.2 kg), SpO2 99 %.   General: No apparent distress alert and oriented x3 mood and affect normal, dressed appropriately.  HEENT: Pupils equal, extraocular movements intact  Respiratory: Patient's speak in full sentences and does not appear short of breath  Cardiovascular: No lower extremity edema, non tender, no erythema   Back exam still shows some tightness noted in the parascapular region.  Still has tightness around the sacroiliac joint bilaterally.  Negative straight leg test.  Patient is having less pain now in the chest.  She has had previously which is good sign and showing some progression.  Osteopathic findings  C2 flexed rotated and side bent right C6 flexed rotated and side bent left T3 extended rotated and side bent right inhaled rib T9 extended rotated and side bent left L2 flexed rotated and side bent right Sacrum right on right       Assessment and Plan:  Back pain Chronic overall.  Mild exacerbation that could been secondary to some decrease in activity secondary to patient not being as active  with patient's most recent hospitalization and enteritis.  Discussed with patient about icing regimen and home exercises otherwise.  Discussed posture follow-up again in 6 to 8 weeks    Nonallopathic problems  Decision today to treat with OMT was based on Physical Exam  After verbal consent patient was treated with HVLA, ME, FPR techniques in cervical, rib, thoracic, lumbar, and sacral  areas  Patient tolerated the procedure well with improvement in symptoms  Patient given  exercises, stretches and lifestyle modifications  See medications in patient instructions if given  Patient will follow up in 4-8 weeks    The above documentation has been reviewed and is accurate and complete Lyndal Pulley, DO          Note: This dictation was prepared with Dragon dictation along with smaller phrase technology. Any transcriptional errors that result from this process are unintentional.

## 2021-11-27 ENCOUNTER — Ambulatory Visit (INDEPENDENT_AMBULATORY_CARE_PROVIDER_SITE_OTHER): Payer: Medicare Other | Admitting: Family Medicine

## 2021-11-27 ENCOUNTER — Encounter: Payer: Self-pay | Admitting: Family Medicine

## 2021-11-27 VITALS — BP 102/62 | HR 68 | Ht 67.0 in | Wt 157.0 lb

## 2021-11-27 DIAGNOSIS — G8929 Other chronic pain: Secondary | ICD-10-CM

## 2021-11-27 DIAGNOSIS — M9903 Segmental and somatic dysfunction of lumbar region: Secondary | ICD-10-CM

## 2021-11-27 DIAGNOSIS — M9901 Segmental and somatic dysfunction of cervical region: Secondary | ICD-10-CM | POA: Diagnosis not present

## 2021-11-27 DIAGNOSIS — M9902 Segmental and somatic dysfunction of thoracic region: Secondary | ICD-10-CM

## 2021-11-27 DIAGNOSIS — M9908 Segmental and somatic dysfunction of rib cage: Secondary | ICD-10-CM

## 2021-11-27 DIAGNOSIS — M545 Low back pain, unspecified: Secondary | ICD-10-CM | POA: Diagnosis not present

## 2021-11-27 DIAGNOSIS — M9904 Segmental and somatic dysfunction of sacral region: Secondary | ICD-10-CM | POA: Diagnosis not present

## 2021-11-27 NOTE — Assessment & Plan Note (Signed)
Chronic overall.  Mild exacerbation that could been secondary to some decrease in activity secondary to patient not being as active with patient's most recent hospitalization and enteritis.  Discussed with patient about icing regimen and home exercises otherwise.  Discussed posture follow-up again in 6 to 8 weeks

## 2021-11-27 NOTE — Patient Instructions (Signed)
Good to see you Ask other provider about Effexor Overall back is not bad Try again in 6 weeks

## 2021-12-24 NOTE — Progress Notes (Unsigned)
[place] Phone: [phone] Subjective:   Bianca Bianca [name], am serving as a scribe for Dr. Hulan [name].  I'm seeing this patient by the request  of:  Bianca Kil, PA-C  CC: Polyarthralgia follow-up  [id number]  Bianca Bianca [name] is a 56 y.o. female coming in with complaint of back and neck pain. OMT on 11/27/2021. Patient states that she had increase in back pain due to moving a rug approximately 2 weeks ago. Also slipped in bathroom last week and felt like her back twisted.   L arm and feet have been more numb. When her bp lowers her back pain will increase.  Patient still states that she has noticed as now after this last injury.  Sometimes can catch her and gives her difficulty with her breath.  Medications patient has been prescribed: None  Taking:         Reviewed prior external information including notes and imaging from previsou exam, outside providers and external EMR if available.   As well as notes that were available from care everywhere and other healthcare systems.  Past medical history, social, surgical and family history all reviewed in electronic medical record.  Bianca [name] information unless stated regarding to the chief complaint.   Past Medical History:  Diagnosis Date   Allergy to alpha-gal    Thyroid disease     Allergies  Allergen Reactions   Atropine Sulfate Other (See Comments)    Low BP; numbness in both arms    Beef (Bovine) Protein Anaphylaxis    Alpha-gal syndrome    Beef-Derived Products Anaphylaxis    Alpha-gal syndrome    Gelatin Anaphylaxis    Alpha-gal syndrome    Heparin Anaphylaxis    Porcine heparin - Alpha-gal syndrome     Heparin (Bovine) Anaphylaxis    Alpha-gal syndrome   Milk (Cow) Anaphylaxis    Alpha-gal syndrome    Milk-Related Compounds Anaphylaxis    Alpha-gal syndrome    Pork-Derived Products Anaphylaxis    Alpha-gal  syndrome    Sulfa Antibiotics Anaphylaxis   Prochlorperazine     Lowered BP; fainted    Shellfish Allergy Hives    Had reaction once to shrimp     Review of Systems:  Bianca [name] headache, visual changes, nausea, vomiting, diarrhea, constipation, dizziness,, skin rash, fevers, chills, night sweats, weight loss, swollen lymph nodes, , joint swelling, chest pain, shortness of breath, mood changes. POSITIVE muscle aches, body aches, abdominal pain  Objective  Blood pressure 100/60, pulse 64, height '5\' 7"'$  (1.702 m), weight 157 lb (71.2 kg).   General: Bianca [name] apparent distress alert and oriented x3 mood and affect normal, dressed appropriately.  HEENT: Pupils equal, extraocular movements intact  Respiratory: Patient's speak in full sentences and does not appear short of breath  Cardiovascular: Bianca [name] lower extremity edema, non tender, Bianca [name] erythema  Gait normal MSK:  Back does have loss of lordosis.  Does have tightness noted mostly in the thoracolumbar juncture.  Mostly seems to be right greater than left.  Negative straight leg test.  Patient has more pain also seems to go around the rib and the floating ribs right greater than left.  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  Patient is very fortunate patient now being able to be followed by oncology less frequency with such great scans at the moment.  Continuing to have difficulty getting nutrition secondary to her alpha gal allergy.  Could consider other medications but at the moment we will hold with patient having difficulty as well with allergies.  He is responding to manipulation.  Follow-up  again in 6 to 8 weeks.  Total time reviewing patient's outside records and discussing with patient 33 minutes    Nonallopathic problems  Decision today to treat with OMT was based on Physical Exam  After verbal consent patient was treated with HVLA, ME, FPR techniques in cervical, rib, thoracic, lumbar, and sacral  areas  Patient tolerated the procedure well with improvement in symptoms  Patient given exercises, stretches and lifestyle modifications  See medications in patient instructions if given  Patient will follow up in 4-8 weeks    The above documentation has been reviewed and is accurate and complete Bianca Pulley, DO          Note: This dictation was prepared with Dragon dictation along with smaller phrase technology. Any transcriptional errors that result from this process are unintentional.

## 2021-12-25 ENCOUNTER — Ambulatory Visit (INDEPENDENT_AMBULATORY_CARE_PROVIDER_SITE_OTHER): Payer: Medicare Other | Admitting: Family Medicine

## 2021-12-25 VITALS — BP 100/60 | HR 64 | Ht 67.0 in | Wt 157.0 lb

## 2021-12-25 DIAGNOSIS — M9903 Segmental and somatic dysfunction of lumbar region: Secondary | ICD-10-CM | POA: Diagnosis not present

## 2021-12-25 DIAGNOSIS — M545 Low back pain, unspecified: Secondary | ICD-10-CM | POA: Diagnosis not present

## 2021-12-25 DIAGNOSIS — G8929 Other chronic pain: Secondary | ICD-10-CM

## 2021-12-25 DIAGNOSIS — M9901 Segmental and somatic dysfunction of cervical region: Secondary | ICD-10-CM

## 2021-12-25 DIAGNOSIS — M9908 Segmental and somatic dysfunction of rib cage: Secondary | ICD-10-CM

## 2021-12-25 DIAGNOSIS — M9902 Segmental and somatic dysfunction of thoracic region: Secondary | ICD-10-CM | POA: Diagnosis not present

## 2021-12-25 DIAGNOSIS — M9904 Segmental and somatic dysfunction of sacral region: Secondary | ICD-10-CM

## 2021-12-25 NOTE — Patient Instructions (Signed)
Glad you are feeling better Congrats on over 6 months See me in 6-8 weeks

## 2021-12-25 NOTE — Assessment & Plan Note (Signed)
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  Patient is very fortunate patient now being able to be followed by oncology less frequency with such great scans at the moment.  Continuing to have difficulty getting nutrition secondary to her alpha gal allergy.  Could consider other medications but at the moment we will hold with patient having difficulty as well with allergies.  He is responding to manipulation.  Follow-up again in 6 to 8 weeks.  Total time reviewing patient's outside records and discussing with patient 33 minutes

## 2022-01-21 NOTE — Progress Notes (Signed)
Benito Mccreedy D.[place] Phone: [phone]   Assessment and Plan:     1. Neck pain 2. Chronic bilateral low back pain without sciatica 3. Somatic dysfunction of cervical region 4. Somatic dysfunction of thoracic region 5. Somatic dysfunction of lumbar region 6. Somatic dysfunction of pelvic region 7. Somatic dysfunction of rib region  -Chronic with exacerbation, subsequent sports medicine visit - Recurrence of upper thoracic, rib, cervical pain similar to previous pain. - Patient has received significant relief with OMT in the past.  Elects for repeat OMT today.  Tolerated well per note below. - Decision today to treat with OMT was based on Physical Exam   After verbal consent patient was treated with HVLA (high velocity low amplitude), ME (muscle energy), FPR (flex positional release), ST (soft tissue), PC/PD (Pelvic Compression/ Pelvic Decompression) techniques in cervical, rib, thoracic, lumbar, and pelvic areas. Patient tolerated the procedure well with improvement in symptoms.  Patient educated on potential side effects of soreness and recommended to rest, hydrate, and use Tylenol as needed for pain control.   Pertinent previous records reviewed include none   Follow Up: As needed for reevaluation and could consider repeat OMT   Subjective:   I, Bianca [name], am serving as a Education administrator for Doctor Glennon Mac  Chief Complaint: OMT  HPI:  Bianca [name] is a 56 y.o. female coming in with complaint of back and neck pain. OMT on 11/27/2021. Patient states that she had increase in back pain due to moving a rug approximately 2 weeks ago. Also slipped in bathroom last week and felt like her back twisted.    L arm and feet have been more numb. When her bp lowers her back pain will increase.  Patient still states that she has noticed as now after this last injury.  Sometimes can catch her and gives her difficulty with  her breath.   Medications patient has been prescribed: None   Taking:  01/22/2022 Patient states that she is pretty mobile this week she is recovering a virus from this weekend sternum has popped several times this month her neck is gicing her some fits it feels like her head is crooked   Relevant Historical Information: History of cancer in remission for 3 years  Additional pertinent review of systems negative.  Current Outpatient Medications  Medication Sig Dispense Refill   aspirin EC 81 MG tablet Take 1 tablet (81 mg total) by mouth daily. Swallow whole. 30 tablet 11   beclomethasone (QVAR REDIHALER) 40 MCG/ACT inhaler Inhale 1 puff into the lungs 2 (two) times daily. 1 each 0   bismuth subsalicylate (PEPTO BISMOL) 262 MG/15ML suspension Take 30 mLs by mouth daily as needed for indigestion or diarrhea or loose stools.     EPINEPHrine 0.3 mg/0.3 mL IJ SOAJ injection Inject 0.3 mg into the muscle once as needed for anaphylaxis.     levothyroxine (SYNTHROID) 137 MCG tablet Take 68.5 mcg by mouth daily.     No current facility-administered medications for this visit.      Objective:     Vitals:   01/22/22 1404  BP: 118/80  Pulse: 60  SpO2: 100%  Weight: 156 lb (70.8 kg)  Height: '5\' 7"'$  (1.702 m)      Body mass index is 24.43 kg/m.    Physical Exam:     General: Well-appearing, cooperative, sitting comfortably in no acute distress.   OMT Physical Exam:  ASIS Compression Test:  Positive left Cervical: TTP paraspinal, C3 RRSL, C5 RLSR Rib: Bilateral elevated first rib with TTP, worse on the left Thoracic: TTP paraspinal, T3-7 RLSR Lumbar: TTP paraspinal, L1-3 RLSR Pelvis: Left anterior innominate  Electronically signed by:  Benito Mccreedy D.[name] 2:47 PM 01/22/22

## 2022-01-22 ENCOUNTER — Ambulatory Visit (INDEPENDENT_AMBULATORY_CARE_PROVIDER_SITE_OTHER): Payer: Medicare Other | Admitting: Sports Medicine

## 2022-01-22 VITALS — BP 118/80 | HR 60 | Ht 67.0 in | Wt 156.0 lb

## 2022-01-22 DIAGNOSIS — M9902 Segmental and somatic dysfunction of thoracic region: Secondary | ICD-10-CM | POA: Diagnosis not present

## 2022-01-22 DIAGNOSIS — M9901 Segmental and somatic dysfunction of cervical region: Secondary | ICD-10-CM | POA: Diagnosis not present

## 2022-01-22 DIAGNOSIS — M9905 Segmental and somatic dysfunction of pelvic region: Secondary | ICD-10-CM

## 2022-01-22 DIAGNOSIS — M545 Low back pain, unspecified: Secondary | ICD-10-CM

## 2022-01-22 DIAGNOSIS — G8929 Other chronic pain: Secondary | ICD-10-CM

## 2022-01-22 DIAGNOSIS — M9903 Segmental and somatic dysfunction of lumbar region: Secondary | ICD-10-CM | POA: Diagnosis not present

## 2022-01-22 DIAGNOSIS — M542 Cervicalgia: Secondary | ICD-10-CM

## 2022-01-22 DIAGNOSIS — M9908 Segmental and somatic dysfunction of rib cage: Secondary | ICD-10-CM

## 2022-01-22 NOTE — Patient Instructions (Addendum)
Good to see you As needed follow up for MSK

## 2022-01-29 NOTE — Progress Notes (Deleted)
   Benito Mccreedy D.[place] Phone: [phone]   Assessment and Plan:     There are no diagnoses linked to this encounter.  *** - Patient has received significant relief with OMT in the past.  Elects for repeat OMT today.  Tolerated well per note below. - Decision today to treat with OMT was based on Physical Exam   After verbal consent patient was treated with HVLA (high velocity low amplitude), ME (muscle energy), FPR (flex positional release), ST (soft tissue), PC/PD (Pelvic Compression/ Pelvic Decompression) techniques in cervical, rib, thoracic, lumbar, and pelvic areas. Patient tolerated the procedure well with improvement in symptoms.  Patient educated on potential side effects of soreness and recommended to rest, hydrate, and use Tylenol as needed for pain control.   Pertinent previous records reviewed include ***   Follow Up: ***     Subjective:   I, Azariya Freeman, am serving as a Education administrator for Doctor Glennon Mac  Chief Complaint: OMT   HPI:  Bianca [name] is a 56 y.o. female coming in with complaint of back and neck pain. OMT on 11/27/2021. Patient states that she had increase in back pain due to moving a rug approximately 2 weeks ago. Also slipped in bathroom last week and felt like her back twisted.    L arm and feet have been more numb. When her bp lowers her back pain will increase.  Patient still states that she has noticed as now after this last injury.  Sometimes can catch her and gives her difficulty with her breath.   Medications patient has been prescribed: None   Taking:   01/22/2022 Patient states that she is pretty mobile this week she is recovering a virus from this weekend sternum has popped several times this month her neck is gicing her some fits it feels like her head is crooked   02/09/2022 Patient states   Relevant Historical Information: History of cancer in remission for 3  years  Additional pertinent review of systems negative.  Current Outpatient Medications  Medication Sig Dispense Refill   aspirin EC 81 MG tablet Take 1 tablet (81 mg total) by mouth daily. Swallow whole. 30 tablet 11   beclomethasone (QVAR REDIHALER) 40 MCG/ACT inhaler Inhale 1 puff into the lungs 2 (two) times daily. 1 each 0   bismuth subsalicylate (PEPTO BISMOL) 262 MG/15ML suspension Take 30 mLs by mouth daily as needed for indigestion or diarrhea or loose stools.     EPINEPHrine 0.3 mg/0.3 mL IJ SOAJ injection Inject 0.3 mg into the muscle once as needed for anaphylaxis.     levothyroxine (SYNTHROID) 137 MCG tablet Take 68.5 mcg by mouth daily.     No current facility-administered medications for this visit.      Objective:     There were no vitals filed for this visit.    There is no height or weight on file to calculate BMI.    Physical Exam:     General: Well-appearing, cooperative, sitting comfortably in no acute distress.   OMT Physical Exam:  ASIS Compression Test: Positive Right Cervical: TTP paraspinal, *** Rib: Bilateral elevated first rib with TTP Thoracic: TTP paraspinal,*** Lumbar: TTP paraspinal,*** Pelvis: Right anterior innominate  Electronically signed by:  Benito Mccreedy D.[name] 10:56 AM 01/29/22

## 2022-01-30 ENCOUNTER — Ambulatory Visit: Payer: Medicare Other | Admitting: Sports Medicine

## 2022-02-09 ENCOUNTER — Ambulatory Visit: Payer: Medicare Other | Admitting: Sports Medicine

## 2022-02-09 NOTE — Progress Notes (Deleted)
    Benito Mccreedy D.[place] Phone: [phone]   Assessment and Plan:     There are no diagnoses linked to this encounter.  ***   Pertinent previous records reviewed include ***   Follow Up: ***     Subjective:   I, Siler Mavis, am serving as a Education administrator for Doctor Glennon Mac  Chief Complaint: OMT   HPI:  Bianca [name] is a 56 y.o. female coming in with complaint of back and neck pain. OMT on 11/27/2021. Patient states that she had increase in back pain due to moving a rug approximately 2 weeks ago. Also slipped in bathroom last week and felt like her back twisted.    L arm and feet have been more numb. When her bp lowers her back pain will increase.  Patient still states that she has noticed as now after this last injury.  Sometimes can catch her and gives her difficulty with her breath.   Medications patient has been prescribed: None   Taking:   01/22/2022 Patient states that she is pretty mobile this week she is recovering a virus from this weekend sternum has popped several times this month her neck is gicing her some fits it feels like her head is crooked   02/10/2022 Patient states     Relevant Historical Information: History of cancer in remission for 3 years  Additional pertinent review of systems negative.   Current Outpatient Medications:    aspirin EC 81 MG tablet, Take 1 tablet (81 mg total) by mouth daily. Swallow whole., Disp: 30 tablet, Rfl: 11   beclomethasone (QVAR REDIHALER) 40 MCG/ACT inhaler, Inhale 1 puff into the lungs 2 (two) times daily., Disp: 1 each, Rfl: 0   bismuth subsalicylate (PEPTO BISMOL) 262 MG/15ML suspension, Take 30 mLs by mouth daily as needed for indigestion or diarrhea or loose stools., Disp: , Rfl:    EPINEPHrine 0.3 mg/0.3 mL IJ SOAJ injection, Inject 0.3 mg into the muscle once as needed for anaphylaxis., Disp: , Rfl:    levothyroxine (SYNTHROID)  137 MCG tablet, Take 68.5 mcg by mouth daily., Disp: , Rfl:    Objective:     There were no vitals filed for this visit.    There is no height or weight on file to calculate BMI.    Physical Exam:    ***   Electronically signed by:  Benito Mccreedy D.[name] 7:46 AM 02/09/22

## 2022-02-10 ENCOUNTER — Ambulatory Visit: Payer: Medicare Other | Admitting: Sports Medicine

## 2022-02-18 NOTE — Progress Notes (Signed)
Bianca [name] [place] [address] [place] Phone: [phone] Subjective:   [name], am serving as a scribe for Dr. Hulan Saas.  I'm seeing this patient by the request  of:  Teodoro Kil, PA-C  CC: back and neck pain   [id number]  Rosio [name] is a 56 y.o. female coming in with complaint of back and neck pain. OMT on 12/25/2021. Patient states about the same. Having some visions problems. Worried it has to do with her neck issues. No other complaints.  Medications patient has been prescribed: None  Taking:         Reviewed prior external information including notes and imaging from previsou exam, outside providers and external EMR if available.   As well as notes that were available from care everywhere and other healthcare systems.  Past medical history, social, surgical and family history all reviewed in electronic medical record.  No pertanent information unless stated regarding to the chief complaint.   Past Medical History:  Diagnosis Date   Allergy to alpha-gal    Thyroid disease     Allergies  Allergen Reactions   Atropine Sulfate Other (See Comments)    Low BP; numbness in both arms    Beef (Bovine) Protein Anaphylaxis    Alpha-gal syndrome    Beef-Derived Products Anaphylaxis    Alpha-gal syndrome    Gelatin Anaphylaxis    Alpha-gal syndrome    Heparin Anaphylaxis    Porcine heparin - Alpha-gal syndrome     Heparin (Bovine) Anaphylaxis    Alpha-gal syndrome   Milk (Cow) Anaphylaxis    Alpha-gal syndrome    Milk-Related Compounds Anaphylaxis    Alpha-gal syndrome    Pork-Derived Products Anaphylaxis    Alpha-gal syndrome    Sulfa Antibiotics Anaphylaxis   Prochlorperazine     Lowered BP; fainted    Shellfish Allergy Hives    Had reaction once to shrimp     Review of Systems:  No headache, visual changes, nausea, vomiting, diarrhea, constipation, dizziness, abdominal pain, skin  rash, fevers, chills, night sweats, weight loss, swollen lymph nodes, body aches, joint swelling, chest pain, shortness of breath, mood changes. POSITIVE muscle aches  Objective  Blood pressure (!) 104/58, pulse 78, height '5\' 7"'$  (1.702 m), weight 155 lb (70.3 kg), SpO2 98 %.   General: No apparent distress alert and oriented x3 mood and affect normal, dressed appropriately.  HEENT: Pupils equal, extraocular movements intact  Respiratory: Patient's speak in full sentences and does not appear short of breath  Cardiovascular: No lower extremity edema, non tender, no erythema  MSK:  Back   Osteopathic findings  C3 flexed rotated and side bent right C6 flexed rotated and side bent left T3 extended rotated and side bent right inhaled rib T9 extended rotated and side bent inhaled rib on the right L2 flexed rotated and side bent right Sacrum right on right     Assessment and Plan:  Back pain Patient does have a chronic back pain.  That is since we have started neovascularization certain things such as her autonomic dysfunction has improved significantly.  Patient seems to also not having as much difficulty with her diaphragm.  Patient has continued to follow-up with her other providers for her other chronic aspects.  Patient has been in remission for greater than 2 years and continues to increase her activity.  Encourage patient to be as active as she would like to be looked back at the progress she  has made.  Follow-up with me again in 4 to 8 weeks.  Total time reviewing outside records and discussing with patient 33 minutes    Nonallopathic problems  Decision today to treat with OMT was based on Physical Exam  After verbal consent patient was treated with HVLA, ME, FPR techniques in cervical, rib, thoracic, lumbar, and sacral  areas  Patient tolerated the procedure well with improvement in symptoms  Patient given exercises, stretches and lifestyle modifications  See medications in  patient instructions if given  Patient will follow up in 4-8 weeks    The above documentation has been reviewed and is accurate and complete Lyndal Pulley, DO          Note: This dictation was prepared with Dragon dictation along with smaller phrase technology. Any transcriptional errors that result from this process are unintentional.

## 2022-02-19 ENCOUNTER — Ambulatory Visit (INDEPENDENT_AMBULATORY_CARE_PROVIDER_SITE_OTHER): Payer: Medicare Other | Admitting: Family Medicine

## 2022-02-19 VITALS — BP 104/58 | HR 78 | Ht 67.0 in | Wt 155.0 lb

## 2022-02-19 DIAGNOSIS — M545 Low back pain, unspecified: Secondary | ICD-10-CM

## 2022-02-19 DIAGNOSIS — M9901 Segmental and somatic dysfunction of cervical region: Secondary | ICD-10-CM | POA: Diagnosis not present

## 2022-02-19 DIAGNOSIS — M9903 Segmental and somatic dysfunction of lumbar region: Secondary | ICD-10-CM | POA: Diagnosis not present

## 2022-02-19 DIAGNOSIS — M9902 Segmental and somatic dysfunction of thoracic region: Secondary | ICD-10-CM

## 2022-02-19 DIAGNOSIS — G8929 Other chronic pain: Secondary | ICD-10-CM | POA: Diagnosis not present

## 2022-02-19 DIAGNOSIS — M9904 Segmental and somatic dysfunction of sacral region: Secondary | ICD-10-CM

## 2022-02-19 DIAGNOSIS — M9908 Segmental and somatic dysfunction of rib cage: Secondary | ICD-10-CM | POA: Diagnosis not present

## 2022-02-19 NOTE — Patient Instructions (Signed)
Good to see you! Be proud of what you've done

## 2022-02-20 NOTE — Assessment & Plan Note (Signed)
Patient does have a chronic back pain.  That is since we have started neovascularization certain things such as her autonomic dysfunction has improved significantly.  Patient seems to also not having as much difficulty with her diaphragm.  Patient has continued to follow-up with her other providers for her other chronic aspects.  Patient has been in remission for greater than 2 years and continues to increase her activity.  Encourage patient to be as active as she would like to be looked back at the progress she has made.  Follow-up with me again in 4 to 8 weeks.  Total time reviewing outside records and discussing with patient 33 minutes

## 2022-04-01 ENCOUNTER — Encounter: Payer: Self-pay | Admitting: Family Medicine

## 2022-04-01 NOTE — Progress Notes (Signed)
[place] Phone: [phone] Subjective:   [name], am serving as a scribe for Dr. Hulan Saas. I'm seeing this patient by the request  of:  Teodoro Kil, PA-C  CC: Neck and back pain follow-up  [id number]  Bianca [name] is a 56 y.o. female coming in with complaint of back and neck pain. OMT 02/19/2022. Patient states that she is out of place in lumbar spine. Pain is better than it was when she first started as a patient here.   Medications patient has been prescribed: None  Taking:         Reviewed prior external information including notes and imaging from previsou exam, outside providers and external EMR if available.   As well as notes that were available from care everywhere and other healthcare systems.  Continues to follow-up with her oncologist as well as has seen an ophthalmologist for her transient loss of vision.  Patient was to be scheduled for an MRI of the brain with and without contrast and an MRA to evaluate for vertebrobasilar insufficiency.  These tests do not appear to be done yet.  Patient is also seen urology for the hematuria  Past medical history, social, surgical and family history all reviewed in electronic medical record.  No pertanent information unless stated regarding to the chief complaint.   Past Medical History:  Diagnosis Date   Allergy to alpha-gal    Thyroid disease     Allergies  Allergen Reactions   Atropine Sulfate Other (See Comments)    Low BP; numbness in both arms    Beef (Bovine) Protein Anaphylaxis    Alpha-gal syndrome    Beef-Derived Products Anaphylaxis    Alpha-gal syndrome    Gelatin Anaphylaxis    Alpha-gal syndrome    Heparin Anaphylaxis    Porcine heparin - Alpha-gal syndrome     Heparin (Bovine) Anaphylaxis    Alpha-gal syndrome   Milk (Cow) Anaphylaxis    Alpha-gal syndrome    Milk-Related Compounds Anaphylaxis     Alpha-gal syndrome    Pork-Derived Products Anaphylaxis    Alpha-gal syndrome    Sulfa Antibiotics Anaphylaxis   Prochlorperazine     Lowered BP; fainted    Shellfish Allergy Hives    Had reaction once to shrimp     Review of Systems:  No headache, visual changes, nausea, vomiting, diarrhea, constipation, dizziness,  skin rash, fevers, chills, night sweats, weight loss, swollen lymph nodes, joint swelling, chest pain, shortness of breath, mood changes. POSITIVE muscle aches, body aches, abdominal pain  Objective  Blood pressure 102/68, pulse 61, height '5\' 7"'$  (1.702 m), weight 151 lb (68.5 kg), SpO2 98 %.   General: No apparent distress alert and oriented x3 mood and affect normal, dressed appropriately.  HEENT: Pupils equal, extraocular movements intact  Respiratory: Patient's speak in full sentences and does not appear short of breath  Cardiovascular: No lower extremity edema, non tender, no erythema  More pain in the upper back.  Seems to be in the parascapular region bilaterally.  Tenderness to palpation is out of proportion.  Osteopathic findings  C2 flexed rotated and side bent right C6 flexed rotated and side bent left T3 extended rotated and side bent right inhaled rib T9 extended rotated and side bent left T11 flexed rotated right side bent left       Assessment and Plan:  Back pain Continues to have back pain.  Total time with patient  today discussing everything with her, and reviewing outside records including most recent urology and oncology notes 33 minutes.  Patient did respond well to osteopathic manipulation.  Overall patient is doing relatively well.  We discussed with patient that we need to continue to monitor and continue to be active.  Follow-up again in 6 to 8 weeks.  Continue to supplement the deficiencies in the B12 and vitamin D and iron deficiency.    Nonallopathic problems  Decision today to treat with OMT was based on Physical Exam  After  verbal consent patient was treated with HVLA, ME, FPR techniques in cervical, rib, thoracic,  areas  Patient tolerated the procedure well with improvement in symptoms  Patient given exercises, stretches and lifestyle modifications  See medications in patient instructions if given  Patient will follow up in 4-8 weeks      The above documentation has been reviewed and is accurate and complete Lyndal Pulley, DO        Note: This dictation was prepared with Dragon dictation along with smaller phrase technology. Any transcriptional errors that result from this process are unintentional.

## 2022-04-02 ENCOUNTER — Ambulatory Visit (INDEPENDENT_AMBULATORY_CARE_PROVIDER_SITE_OTHER): Payer: Medicare Other | Admitting: Family Medicine

## 2022-04-02 ENCOUNTER — Encounter: Payer: Self-pay | Admitting: Family Medicine

## 2022-04-02 VITALS — BP 102/68 | HR 61 | Ht 67.0 in | Wt 151.0 lb

## 2022-04-02 DIAGNOSIS — M545 Low back pain, unspecified: Secondary | ICD-10-CM | POA: Diagnosis not present

## 2022-04-02 DIAGNOSIS — G8929 Other chronic pain: Secondary | ICD-10-CM

## 2022-04-02 DIAGNOSIS — M9908 Segmental and somatic dysfunction of rib cage: Secondary | ICD-10-CM

## 2022-04-02 DIAGNOSIS — M9901 Segmental and somatic dysfunction of cervical region: Secondary | ICD-10-CM

## 2022-04-02 DIAGNOSIS — M9902 Segmental and somatic dysfunction of thoracic region: Secondary | ICD-10-CM

## 2022-04-02 NOTE — Patient Instructions (Signed)
Good to see you Keep doing all the fun stuff but take hiatus from disc golf Good luck with walking the balls See me again in 6-8 weeks

## 2022-04-03 ENCOUNTER — Encounter: Payer: Self-pay | Admitting: Family Medicine

## 2022-04-03 NOTE — Assessment & Plan Note (Signed)
Continues to have back pain.  Total time with patient today discussing everything with her, and reviewing outside records including most recent urology and oncology notes 33 minutes.  Patient did respond well to osteopathic manipulation.  Overall patient is doing relatively well.  We discussed with patient that we need to continue to monitor and continue to be active.  Follow-up again in 6 to 8 weeks.  Continue to supplement the deficiencies in the B12 and vitamin D and iron deficiency.

## 2022-04-27 NOTE — Progress Notes (Unsigned)
    Benito Mccreedy D.[place] Phone: [phone]   Assessment and Plan:     There are no diagnoses linked to this encounter.  ***   Pertinent previous records reviewed include ***   Follow Up: ***     Subjective:   I, Aylanie Cubillos, am serving as a Education administrator for Doctor Glennon Mac  Chief Complaint: OMT   HPI:  Blasa Raisch is a 56 y.o. female coming in with complaint of back and neck pain. OMT on 11/27/2021. Patient states that she had increase in back pain due to moving a rug approximately 2 weeks ago. Also slipped in bathroom last week and felt like her back twisted.    L arm and feet have been more numb. When her bp lowers her back pain will increase.  Patient still states that she has noticed as now after this last injury.  Sometimes can catch her and gives her difficulty with her breath.   Medications patient has been prescribed: None   Taking:   01/22/2022 Patient states that she is pretty mobile this week she is recovering a virus from this weekend sternum has popped several times this month her neck is gicing her some fits it feels like her head is crooked    04/02/2022 Zeffie Bickert is a 56 y.o. female coming in with complaint of back and neck pain. OMT 02/19/2022. Patient states that she is out of place in lumbar spine. Pain is better than it was when she first started as a patient here.    04/28/2022 Patient states    Relevant Historical Information: History of cancer in remission for 3 years  Additional pertinent review of systems negative.   Current Outpatient Medications:    aspirin EC 81 MG tablet, Take 1 tablet (81 mg total) by mouth daily. Swallow whole., Disp: 30 tablet, Rfl: 11   beclomethasone (QVAR REDIHALER) 40 MCG/ACT inhaler, Inhale 1 puff into the lungs 2 (two) times daily., Disp: 1 each, Rfl: 0   bismuth subsalicylate (PEPTO BISMOL) 262 MG/15ML suspension, Take 30  mLs by mouth daily as needed for indigestion or diarrhea or loose stools., Disp: , Rfl:    EPINEPHrine 0.3 mg/0.3 mL IJ SOAJ injection, Inject 0.3 mg into the muscle once as needed for anaphylaxis., Disp: , Rfl:    levothyroxine (SYNTHROID) 137 MCG tablet, Take 68.5 mcg by mouth daily., Disp: , Rfl:    Objective:     There were no vitals filed for this visit.    There is no height or weight on file to calculate BMI.    Physical Exam:    ***   Electronically signed by:  Benito Mccreedy D.[name] 11:59 AM 04/27/22

## 2022-04-28 ENCOUNTER — Ambulatory Visit (INDEPENDENT_AMBULATORY_CARE_PROVIDER_SITE_OTHER): Payer: Medicare Other | Admitting: Sports Medicine

## 2022-04-28 VITALS — HR 79 | Ht 67.0 in | Wt 151.0 lb

## 2022-04-28 DIAGNOSIS — M9908 Segmental and somatic dysfunction of rib cage: Secondary | ICD-10-CM

## 2022-04-28 DIAGNOSIS — M545 Low back pain, unspecified: Secondary | ICD-10-CM | POA: Diagnosis not present

## 2022-04-28 DIAGNOSIS — M542 Cervicalgia: Secondary | ICD-10-CM | POA: Diagnosis not present

## 2022-04-28 DIAGNOSIS — M9905 Segmental and somatic dysfunction of pelvic region: Secondary | ICD-10-CM | POA: Diagnosis not present

## 2022-04-28 DIAGNOSIS — M9902 Segmental and somatic dysfunction of thoracic region: Secondary | ICD-10-CM | POA: Diagnosis not present

## 2022-04-28 DIAGNOSIS — M9901 Segmental and somatic dysfunction of cervical region: Secondary | ICD-10-CM | POA: Diagnosis not present

## 2022-04-28 DIAGNOSIS — M9903 Segmental and somatic dysfunction of lumbar region: Secondary | ICD-10-CM

## 2022-04-28 DIAGNOSIS — G8929 Other chronic pain: Secondary | ICD-10-CM

## 2022-04-28 NOTE — Patient Instructions (Signed)
Good to see you   

## 2022-04-30 ENCOUNTER — Ambulatory Visit: Payer: Medicare Other | Admitting: Family Medicine

## 2022-05-21 NOTE — Progress Notes (Deleted)
  [place] [address] [place] Phone: [phone] Subjective:    I'm seeing this patient by the request  of:  Teodoro Kil, PA-C  CC:   [id number]  Bianca [name] is a 57 y.o. female coming in with complaint of back and neck pain. OMT 04/28/2022. Patient states   Medications patient has been prescribed: None  Taking:         Reviewed prior external information including notes and imaging from previsou exam, outside providers and external EMR if available.   As well as notes that were available from care everywhere and other healthcare systems.  Past medical history, social, surgical and family history all reviewed in electronic medical record.  No pertanent information unless stated regarding to the chief complaint.   Past Medical History:  Diagnosis Date   Allergy to alpha-gal    Thyroid disease     Allergies  Allergen Reactions   Atropine Sulfate Other (See Comments)    Low BP; numbness in both arms    Beef (Bovine) Protein Anaphylaxis    Alpha-gal syndrome    Beef-Derived Products Anaphylaxis    Alpha-gal syndrome    Gelatin Anaphylaxis    Alpha-gal syndrome    Heparin Anaphylaxis    Porcine heparin - Alpha-gal syndrome     Heparin (Bovine) Anaphylaxis    Alpha-gal syndrome   Milk (Cow) Anaphylaxis    Alpha-gal syndrome    Milk-Related Compounds Anaphylaxis    Alpha-gal syndrome    Pork-Derived Products Anaphylaxis    Alpha-gal syndrome    Sulfa Antibiotics Anaphylaxis   Prochlorperazine     Lowered BP; fainted    Shellfish Allergy Hives    Had reaction once to shrimp     Review of Systems:  No headache, visual changes, nausea, vomiting, diarrhea, constipation, dizziness, abdominal pain, skin rash, fevers, chills, night sweats, weight loss, swollen lymph nodes, body aches, joint swelling, chest pain, shortness of breath, mood changes. POSITIVE muscle aches  Objective  There were  no vitals taken for this visit.   General: No apparent distress alert and oriented x3 mood and affect normal, dressed appropriately.  HEENT: Pupils equal, extraocular movements intact  Respiratory: Patient's speak in full sentences and does not appear short of breath  Cardiovascular: No lower extremity edema, non tender, no erythema  Gait MSK:  Back   Osteopathic findings  C2 flexed rotated and side bent right C6 flexed rotated and side bent left T3 extended rotated and side bent right inhaled rib T9 extended rotated and side bent left L2 flexed rotated and side bent right Sacrum right on right       Assessment and Plan:  No problem-specific Assessment & Plan notes found for this encounter.    Nonallopathic problems  Decision today to treat with OMT was based on Physical Exam  After verbal consent patient was treated with HVLA, ME, FPR techniques in cervical, rib, thoracic, lumbar, and sacral  areas  Patient tolerated the procedure well with improvement in symptoms  Patient given exercises, stretches and lifestyle modifications  See medications in patient instructions if given  Patient will follow up in 4-8 weeks             Note: This dictation was prepared with Dragon dictation along with smaller phrase technology. Any transcriptional errors that result from this process are unintentional.

## 2022-05-28 ENCOUNTER — Ambulatory Visit: Payer: Medicare Other | Admitting: Family Medicine

## 2022-06-29 ENCOUNTER — Ambulatory Visit (INDEPENDENT_AMBULATORY_CARE_PROVIDER_SITE_OTHER): Payer: Medicare Other

## 2022-06-29 ENCOUNTER — Ambulatory Visit (INDEPENDENT_AMBULATORY_CARE_PROVIDER_SITE_OTHER): Payer: Medicare Other | Admitting: Family Medicine

## 2022-06-29 VITALS — BP 102/72 | HR 78 | Ht 67.0 in

## 2022-06-29 DIAGNOSIS — M79671 Pain in right foot: Secondary | ICD-10-CM

## 2022-06-29 NOTE — Patient Instructions (Addendum)
Thank you for coming in today.   Wear a post-op shoe  Check back in 2 weeks  Keep me updated.   Please go to [facility] supply to get the Post op we talked about today. You may also be able to get it from [employer].

## 2022-06-29 NOTE — Progress Notes (Unsigned)
   I, Peterson Lombard, LAT, ATC acting as a scribe for Lynne Leader, MD.  Bianca [name] is a 57 y.o. female who presents to [place] [facility] today for R foot pain. Pt was last seen by Dr. Glennon Mac on 04/28/22 for OMT. Today, pt c/o R foot pain ongoing since last night. MOI: Pt stepped in a hole and rolled her foot. Pt locates pain to the MT, mostly around the 3-4th. Pt reports she has a hx of a 5th MT fx and now that bone is "angled."  R foot swelling: no- she doesn't typically swell Aggravates: walking, bearing weight, PF, toe-off, toe ext Treatments tried: ice, elevation, rest  Pertinent review of systems: No fevers or chills  Relevant historical information: History of metastatic colon cancer in remission for over 4 years   Exam:  BP 102/72   Pulse 78   Ht '5\' 7"'$  (1.702 m)   SpO2 98%   BMI 23.65 kg/m  General: Well Developed, well nourished, and in no acute distress.   MSK: Right foot: Swelling across third and fourth metatarsal heads dorsally.  Tender palpation in this region.  Normal foot and ankle motion. Pulses capillary fill and sensation are intact distally.    Lab and Radiology Results  X-ray images right foot obtained today personally and independently interpreted. No acute fractures are visible.  No aggressive appearing bony lesions are visible. Await formal radiology review.     Assessment and Plan: 57 y.o. female with forefoot pain after toe dorsiflexion injury across third fourth and perhaps fifth MTPs.  Effectively believes she had a capsule strain.  I do not see a fracture on x-ray.  Radiology overread is still pending.  We discussed options.  Plan for postop shoe.  Recheck in about 2 weeks.  PDMP not reviewed this encounter. Orders Placed This Encounter  Procedures   DG Foot Complete Right    Standing Status:   Future    Number of Occurrences:   1    Standing Expiration Date:   06/30/2023    Order Specific Question:   Reason for  Exam (SYMPTOM  OR DIAGNOSIS REQUIRED)    Answer:   right foot pain    Order Specific Question:   Preferred imaging location?    Answer:   [facility]    Order Specific Question:   Is patient pregnant?    Answer:   No   No orders of the defined types were placed in this encounter.    Discussed warning signs or symptoms. Please see discharge instructions. Patient expresses understanding.   The above documentation has been reviewed and is accurate and complete Lynne Leader, M.D.

## 2022-07-01 NOTE — Progress Notes (Signed)
Right foot x-ray shows a little bit of a heel spur but otherwise looks okay to radiology.  No fractures are visible.

## 2022-07-08 NOTE — Progress Notes (Unsigned)
Bianca [name] D.[place] Phone: [phone]   Assessment and Plan:     There are no diagnoses linked to this encounter.  *** - Patient has received significant relief with OMT in the past.  Elects for repeat OMT today.  Tolerated well per note below. - Decision today to treat with OMT was based on Physical Exam   After verbal consent patient was treated with HVLA (high velocity low amplitude), ME (muscle energy), FPR (flex positional release), ST (soft tissue), PC/PD (Pelvic Compression/ Pelvic Decompression) techniques in cervical, rib, thoracic, lumbar, and pelvic areas. Patient tolerated the procedure well with improvement in symptoms.  Patient educated on potential side effects of soreness and recommended to rest, hydrate, and use Tylenol as needed for pain control.   Pertinent previous records reviewed include ***   Follow Up: ***     Subjective:   I, Bianca [name], am serving as a Education administrator for Doctor Glennon Mac  Chief Complaint: OMT   HPI:  Bianca [name] is a 56 y.o. female coming in with complaint of back and neck pain. OMT on 11/27/2021. Patient states that she had increase in back pain due to moving a rug approximately 2 weeks ago. Also slipped in bathroom last week and felt like her back twisted.    L arm and feet have been more numb. When her bp lowers her back pain will increase.  Patient still states that she has noticed as now after this last injury.  Sometimes can catch her and gives her difficulty with her breath.   Medications patient has been prescribed: None   Taking:   01/22/2022 Patient states that she is pretty mobile this week she is recovering a virus from this weekend sternum has popped several times this month her neck is gicing her some fits it feels like her head is crooked    04/02/2022 Bianca [name] is a 57 y.o. female coming in with complaint of back and neck pain. OMT  02/19/2022. Patient states that she is out of place in lumbar spine. Pain is better than it was when she first started as a patient here.     04/28/2022 Patient states that she is okay here for a tune up  07/09/2022 Patient states     Relevant Historical Information: History of cancer in remission for 3 years  Additional pertinent review of systems negative.  Current Outpatient Medications  Medication Sig Dispense Refill   aspirin EC 81 MG tablet Take 1 tablet (81 mg total) by mouth daily. Swallow whole. 30 tablet 11   beclomethasone (QVAR REDIHALER) 40 MCG/ACT inhaler Inhale 1 puff into the lungs 2 (two) times daily. 1 each 0   bismuth subsalicylate (PEPTO BISMOL) 262 MG/15ML suspension Take 30 mLs by mouth daily as needed for indigestion or diarrhea or loose stools.     EPINEPHrine 0.3 mg/0.3 mL IJ SOAJ injection Inject 0.3 mg into the muscle once as needed for anaphylaxis.     levothyroxine (SYNTHROID) 137 MCG tablet Take 68.5 mcg by mouth daily.     No current facility-administered medications for this visit.      Objective:     There were no vitals filed for this visit.    There is no height or weight on file to calculate BMI.    Physical Exam:     General: Well-appearing, cooperative, sitting comfortably in no acute distress.   OMT Physical Exam:  ASIS Compression Test: Positive  Right Cervical: TTP paraspinal, *** Rib: Bilateral elevated first rib with TTP Thoracic: TTP paraspinal,*** Lumbar: TTP paraspinal,*** Pelvis: Right anterior innominate  Electronically signed by:  Bianca [name] D.[name] 4:26 PM 07/08/22

## 2022-07-09 ENCOUNTER — Ambulatory Visit (INDEPENDENT_AMBULATORY_CARE_PROVIDER_SITE_OTHER): Payer: Medicare Other | Admitting: Sports Medicine

## 2022-07-09 VITALS — HR 82 | Ht 67.0 in | Wt 156.0 lb

## 2022-07-09 DIAGNOSIS — M9906 Segmental and somatic dysfunction of lower extremity: Secondary | ICD-10-CM

## 2022-07-09 DIAGNOSIS — M79671 Pain in right foot: Secondary | ICD-10-CM

## 2022-07-09 DIAGNOSIS — G8929 Other chronic pain: Secondary | ICD-10-CM

## 2022-07-09 DIAGNOSIS — M9904 Segmental and somatic dysfunction of sacral region: Secondary | ICD-10-CM

## 2022-07-09 DIAGNOSIS — M9902 Segmental and somatic dysfunction of thoracic region: Secondary | ICD-10-CM | POA: Diagnosis not present

## 2022-07-09 DIAGNOSIS — M9905 Segmental and somatic dysfunction of pelvic region: Secondary | ICD-10-CM | POA: Diagnosis not present

## 2022-07-09 DIAGNOSIS — M546 Pain in thoracic spine: Secondary | ICD-10-CM | POA: Diagnosis not present

## 2022-07-09 DIAGNOSIS — M9903 Segmental and somatic dysfunction of lumbar region: Secondary | ICD-10-CM | POA: Diagnosis not present

## 2022-07-09 DIAGNOSIS — M9901 Segmental and somatic dysfunction of cervical region: Secondary | ICD-10-CM | POA: Diagnosis not present

## 2022-07-09 DIAGNOSIS — M542 Cervicalgia: Secondary | ICD-10-CM

## 2022-07-13 NOTE — Progress Notes (Unsigned)
Bianca [name] [address] [place] Phone: [phone] Subjective:   [name], am serving as a scribe for Dr. Hulan Saas.  I'm seeing this patient by the request  of:  Teodoro Kil, PA-C  CC: back and neck pain   [id number]  Bianca [name] is a 57 y.o. female coming in with complaint of back and neck pain. OMT on 04/02/2022. Follow up foot pain seen by Dr. Georgina Snell and Dr. Glennon Mac. Patient states twisted foot when she stepped in a hole, still hurting. Here for manipulation. No other concerns.  Medications patient has been prescribed:   Taking:      Xray of foot unremarkable    Reviewed prior external information including notes and imaging from previsou exam, outside providers and external EMR if available.   As well as notes that were available from care everywhere and other healthcare systems.  Past medical history, social, surgical and family history all reviewed in electronic medical record.  No pertanent information unless stated regarding to the chief complaint.   Past Medical History:  Diagnosis Date   Allergy to alpha-gal    Thyroid disease     Allergies  Allergen Reactions   Atropine Sulfate Other (See Comments)    Low BP; numbness in both arms    Beef (Bovine) Protein Anaphylaxis    Alpha-gal syndrome    Beef-Derived Products Anaphylaxis    Alpha-gal syndrome    Gelatin Anaphylaxis    Alpha-gal syndrome    Heparin Anaphylaxis    Porcine heparin - Alpha-gal syndrome     Heparin (Bovine) Anaphylaxis    Alpha-gal syndrome   Milk (Cow) Anaphylaxis    Alpha-gal syndrome    Milk-Related Compounds Anaphylaxis    Alpha-gal syndrome    Pork-Derived Products Anaphylaxis    Alpha-gal syndrome    Sulfa Antibiotics Anaphylaxis   Prochlorperazine     Lowered BP; fainted    Shellfish Allergy Hives    Had reaction once to shrimp     Review of Systems:  No headache, visual changes, nausea,  vomiting, diarrhea, constipation, dizziness, skin rash, fevers, chills, night sweats, weight loss, swollen lymph nodes, body aches, joint swelling, chest pain, shortness of breath, mood changes. POSITIVE muscle aches, body aches, abdominal pain  Objective  Blood pressure 110/60, pulse 77, height '5\' 7"'$  (1.702 m), weight 155 lb (70.3 kg), SpO2 97 %.   General: No apparent distress alert and oriented x3 mood and affect normal, dressed appropriately.  HEENT: Pupils equal, extraocular movements intact  Respiratory: Patient's speak in full sentences and does not appear short of breath  Cardiovascular: No lower extremity edema, non tender, no erythema  Gait very minorly antalgic MSK:  Back Continues to have pain in the parascapular area.  Patient's pain more in the parascapular area as well.  Still seems to be mostly in the thoracic area of anywhere else.  Some limited sidebending of the neck bilaterally.  Right foot exam shows the patient does not have any significant inflammation noted by inspection.  Pain over the third and fourth toes noted proximally.  Negative compression to the transverse arch noted.  Neurovascularly intact distally.  Good capillary refill.  Osteopathic findings  C2 flexed rotated and side bent right C7 flexed rotated and side bent left T3 extended rotated and side bent right inhaled rib T9 extended rotated and side bent left L3 flexed rotated and side bent right Sacrum right on right  Assessment and Plan:  Right foot sprain New injury that seems to be more just some inflammation noted of the toes after a forced plantarflexion of the toes.  Discussed with patient about icing regimen and home exercises, rigid soled shoe, topical anti-inflammatories.  Worsening pain consider potential injection but hopeful at that this will not be necessary.  Follow-up again in 6 to 8 weeks  Back pain Mild loss of lordosis  Discussed HEP  Tightness in the scapular area.   Increase activity as tolerated, no new symptoms to be concern.  RTC in 8 weeks     Nonallopathic problems  Decision today to treat with OMT was based on Physical Exam  After verbal consent patient was treated with  ME, FPR techniques in cervical, rib, thoracic, lumbar, and sacral  areas  Patient tolerated the procedure well with improvement in symptoms  Patient given exercises, stretches and lifestyle modifications  See medications in patient instructions if given  Patient will follow up in 4-8 weeks    The above documentation has been reviewed and is accurate and complete Lyndal Pulley, DO          Note: This dictation was prepared with Dragon dictation along with smaller phrase technology. Any transcriptional errors that result from this process are unintentional.

## 2022-07-14 ENCOUNTER — Encounter: Payer: Self-pay | Admitting: Family Medicine

## 2022-07-14 ENCOUNTER — Ambulatory Visit (INDEPENDENT_AMBULATORY_CARE_PROVIDER_SITE_OTHER): Payer: Medicare Other | Admitting: Family Medicine

## 2022-07-14 VITALS — BP 110/60 | HR 77 | Ht 67.0 in | Wt 155.0 lb

## 2022-07-14 DIAGNOSIS — M9901 Segmental and somatic dysfunction of cervical region: Secondary | ICD-10-CM | POA: Diagnosis not present

## 2022-07-14 DIAGNOSIS — G8929 Other chronic pain: Secondary | ICD-10-CM | POA: Diagnosis not present

## 2022-07-14 DIAGNOSIS — M545 Low back pain, unspecified: Secondary | ICD-10-CM

## 2022-07-14 DIAGNOSIS — S93601D Unspecified sprain of right foot, subsequent encounter: Secondary | ICD-10-CM

## 2022-07-14 DIAGNOSIS — M9903 Segmental and somatic dysfunction of lumbar region: Secondary | ICD-10-CM

## 2022-07-14 DIAGNOSIS — M9904 Segmental and somatic dysfunction of sacral region: Secondary | ICD-10-CM

## 2022-07-14 DIAGNOSIS — S93601A Unspecified sprain of right foot, initial encounter: Secondary | ICD-10-CM | POA: Insufficient documentation

## 2022-07-14 DIAGNOSIS — M9902 Segmental and somatic dysfunction of thoracic region: Secondary | ICD-10-CM

## 2022-07-14 DIAGNOSIS — M9908 Segmental and somatic dysfunction of rib cage: Secondary | ICD-10-CM

## 2022-07-14 NOTE — Assessment & Plan Note (Signed)
New injury that seems to be more just some inflammation noted of the toes after a forced plantarflexion of the toes.  Discussed with patient about icing regimen and home exercises, rigid soled shoe, topical anti-inflammatories.  Worsening pain consider potential injection but hopeful at that this will not be necessary.  Follow-up again in 6 to 8 weeks

## 2022-07-14 NOTE — Assessment & Plan Note (Signed)
Mild loss of lordosis  Discussed HEP  Tightness in the scapular area.  Increase activity as tolerated, no new symptoms to be concern.  RTC in 8 weeks

## 2022-07-14 NOTE — Patient Instructions (Addendum)
[place] for foot Voltaren 2x a day to toes [place] sandals in house See you again in 5-6 weeks

## 2022-07-23 ENCOUNTER — Ambulatory Visit: Payer: Medicare Other | Admitting: Sports Medicine

## 2022-07-28 NOTE — Progress Notes (Signed)
Benito Mccreedy D.[place] Phone: [phone]   Assessment and Plan:     1. Acute foot pain, right 2. Pain in joint involving right ankle and foot -subAcute, uncomplicated, subsequent visit 3. Chronic bilateral thoracic back pain -Chronic with exacerbation, subsequent visit 4. Somatic dysfunction of thoracic region 5. Somatic dysfunction of pelvic region 6. Somatic dysfunction of lower extremities  -Patient returns with multiple musculoskeletal complaints with most prominent being in right foot and ankle as well as chronic pain in bilateral hips and upper back - No red flag symptoms on physical exam, so no imaging today - Recommend starting HEP and physical therapy for intrinsic foot muscle strengthening and ankle strengthening.  Referral sent. - Patient has received significant relief with OMT in the past.  Elects for repeat OMT today.  Tolerated well per note below. - Decision today to treat with OMT was based on Physical Exam   After verbal consent patient was treated with HVLA (high velocity low amplitude), ME (muscle energy), FPR (flex positional release), ST (soft tissue), PC/PD (Pelvic Compression/ Pelvic Decompression) techniques in   thoracic, lower extremity, and pelvic areas. Patient tolerated the procedure well with improvement in symptoms.  Patient educated on potential side effects of soreness and recommended to rest, hydrate, and use Tylenol as needed for pain control.   Pertinent previous records reviewed include none   Follow Up: 2 weeks if needed for repeat OMT and discussion of multiple musculoskeletal pains   Subjective:   I, [name], am serving as a Education administrator for [employer]  Chief Complaint: OMT   HPI:  Javonne Pilz is a 57 y.o. female coming in with complaint of back and neck pain. OMT on 11/27/2021. Patient states that she had increase in back pain due to moving a rug  approximately 2 weeks ago. Also slipped in bathroom last week and felt like her back twisted.    L arm and feet have been more numb. When her bp lowers her back pain will increase.  Patient still states that she has noticed as now after this last injury.  Sometimes can catch her and gives her difficulty with her breath.   Medications patient has been prescribed: None   Taking:   01/22/2022 Patient states that she is pretty mobile this week she is recovering a virus from this weekend sternum has popped several times this month her neck is gicing her some fits it feels like her head is crooked    04/02/2022 Laferne Barzee is a 57 y.o. female coming in with complaint of back and neck pain. OMT 02/19/2022. Patient states that she is out of place in lumbar spine. Pain is better than it was when she first started as a patient here.     04/28/2022 Patient states that she is okay here for a tune up   07/09/2022 Patient states that she's here for adjustment , foot pain that she wants to discuss    07/14/2022 Safia Drye is a 57 y.o. female coming in with complaint of back and neck pain. OMT on 04/02/2022. Follow up foot pain seen by Dr. Georgina Snell and Dr. Glennon Mac. Patient states twisted foot when she stepped in a hole, still hurting. Here for manipulation. No other concerns.   08/04/2022 Patient states same ole same , wants to focus on ankle and foot , states she has been popping it back in place    Relevant Historical Information: History of  cancer in remission for 3 years  Additional pertinent review of systems negative.  Current Outpatient Medications  Medication Sig Dispense Refill   aspirin EC 81 MG tablet Take 1 tablet (81 mg total) by mouth daily. Swallow whole. 30 tablet 11   beclomethasone (QVAR REDIHALER) 40 MCG/ACT inhaler Inhale 1 puff into the lungs 2 (two) times daily. 1 each 0   bismuth subsalicylate (PEPTO BISMOL) 262 MG/15ML suspension Take 30 mLs by mouth daily as needed for  indigestion or diarrhea or loose stools.     EPINEPHrine 0.3 mg/0.3 mL IJ SOAJ injection Inject 0.3 mg into the muscle once as needed for anaphylaxis.     levothyroxine (SYNTHROID) 137 MCG tablet Take 68.5 mcg by mouth daily.     No current facility-administered medications for this visit.      Objective:     Vitals:   08/04/22 1453  Pulse: 72  SpO2: 97%  Weight: 156 lb (70.8 kg)  Height: 5\' 7"  (1.702 m)      Body mass index is 24.43 kg/m.    Physical Exam:     General: Well-appearing, cooperative, sitting comfortably in no acute distress.   OMT Physical Exam:  ASIS Compression Test: Positive Right   Thoracic: TTP paraspinal, T5 RLSL Lower extremity: Reduced ROM of first digit that improved with HVLA.  Full ankle ROM.  TTP third metatarsal head Pelvis: Right anterior innominate  Electronically signed by:  Benito Mccreedy D.[name] 4:18 PM 08/04/22

## 2022-08-04 ENCOUNTER — Ambulatory Visit (INDEPENDENT_AMBULATORY_CARE_PROVIDER_SITE_OTHER): Payer: Medicare Other | Admitting: Sports Medicine

## 2022-08-04 VITALS — HR 72 | Ht 67.0 in | Wt 156.0 lb

## 2022-08-04 DIAGNOSIS — M79671 Pain in right foot: Secondary | ICD-10-CM | POA: Diagnosis not present

## 2022-08-04 DIAGNOSIS — M25571 Pain in right ankle and joints of right foot: Secondary | ICD-10-CM

## 2022-08-04 DIAGNOSIS — M9905 Segmental and somatic dysfunction of pelvic region: Secondary | ICD-10-CM | POA: Diagnosis not present

## 2022-08-04 DIAGNOSIS — M9902 Segmental and somatic dysfunction of thoracic region: Secondary | ICD-10-CM

## 2022-08-04 DIAGNOSIS — M546 Pain in thoracic spine: Secondary | ICD-10-CM

## 2022-08-04 DIAGNOSIS — M9906 Segmental and somatic dysfunction of lower extremity: Secondary | ICD-10-CM

## 2022-08-04 DIAGNOSIS — G8929 Other chronic pain: Secondary | ICD-10-CM | POA: Diagnosis not present

## 2022-08-04 NOTE — Patient Instructions (Addendum)
Foot HEP  PT referral  2 week MSK follow up  Recommend warm foot bath and Voltaren gel over areas of pain

## 2022-08-11 NOTE — Therapy (Addendum)
OUTPATIENT PHYSICAL THERAPY LOWER EXTREMITY EVALUATION/Discharge   Patient Name: Bianca [name] MRN: [id number] DOB:April 05, 1966, 57 y.o., female Today's Date: 08/13/2022  END OF SESSION:  PT End of Session - 08/13/22 0554     Visit Number 1    Number of Visits 7    Date for PT Re-Evaluation 10/02/22    Authorization Type BCBS MEDICARE    PT Start Time 1330    PT Stop Time 1420    PT Time Calculation (min) 50 min    Activity Tolerance Patient tolerated treatment well    Behavior During Therapy [facility] for tasks assessed/performed             Past Medical History:  Diagnosis Date   Allergy to alpha-gal    Thyroid disease    Past Surgical History:  Procedure Laterality Date   ADENOIDECTOMY     TONSILLECTOMY     Patient Active Problem List   Diagnosis Date Noted   Right foot sprain 07/14/2022   Hematuria 08/01/2021   Left knee pain 08/29/2020   Vitamin B12 deficiency 03/12/2020   Back pain 03/06/2020   Diaphragmatic disorder 03/06/2020   Nonallopathic lesion of thoracic region 03/06/2020   Autonomic dysfunction 09/07/2019   History of anaphylaxis 09/07/2019   Personal history of colon cancer, stage IV 09/07/2019   Allergy to alpha-gal 01/04/2019   Hypokalemia 07/27/2018   Metastatic colon cancer to liver (HCC) 04/30/2018   Low ferritin 04/07/2017   Menorrhagia with regular cycle 04/06/2017   Vitamin D deficiency 04/18/2016   Iron deficiency anemia due to chronic blood loss 04/15/2016   Acquired hypothyroidism 03/13/2011    PCP: Arliss Journey, PA-C  REFERRING PROVIDER: Richardean Sale, DO   REFERRING DIAG: [phone] (ICD-10-CM) - Acute foot pain, right, M25.571 (ICD-10-CM) - Pain in joint involving right ankle and foot.  THERAPY DIAG:  Pain in right ankle and joints of right foot  Muscle weakness (generalized)  Difficulty in walking, not elsewhere classified  Rationale for Evaluation and Treatment: Rehabilitation  ONSET DATE: Jun 28 2022  SUBJECTIVE:   SUBJECTIVE STATEMENT: Pt reports on 06/28/22 stepping backward in hole and hyperextending her toes, esp the 3rd and 4th, and hyper-dorsiflexing her ankle and everting her R ankle. Pt states she manipulates and pops her R ankle, heel and toes to help her foot/ankle to be re-aligned and feel better. Pt reports being slightly hypermobile with all of her joints. The pain has been better the past couple of days. Her walking tolerance is currently limited. She normally walks approx 2 miles a day, and would hike occasionally.  PERTINENT HISTORY: See medical history  PAIN:  Are you having pain? Yes: NPRS scale: 6/10 Pain location: R heel/ankle and forefoot  Pain description: Ache and throb Aggravating factors: Movement after rest Relieving factors: Popping   PRECAUTIONS: None  WEIGHT BEARING RESTRICTIONS: No  FALLS:  Has patient fallen in last 6 months? No  LIVING ENVIRONMENT: Lives with: lives alone Lives in: House/apartment Stairs: Yes: Internal: 3 steps; on right going up and External: 3 steps; on right going up Has following equipment at home: None  OCCUPATION: DIsabled  PLOF: Independent  PATIENT GOALS: To hike 2 miles day 6 mile  OBJECTIVE:   DIAGNOSTIC FINDINGS: DG foot 07/01/22 IMPRESSION: No acute fracture or dislocation.  PATIENT SURVEYS:  FOTO: Perceived function   23%, predicted   53%   COGNITION: Overall cognitive status: Within functional limits for tasks assessed     SENSATION: WFL  EDEMA:  Swelling observed  and palpated of the R ankle and forefoot   MUSCLE LENGTH: Hamstrings: Right NT deg; Left NT deg Maisie Fus test: Right NT deg; Left NT deg  POSTURE:  High arches bilat  PALPATION: TTP in around the lat malleolus, peroneal tendons, and 3rd and 4th MTP joints  LOWER EXTREMITY ROM:  Active ROM Right eval Left eval  Hip flexion    Hip extension    Hip abduction    Hip adduction    Hip internal rotation    Hip external  rotation    Knee flexion    Knee extension    Ankle dorsiflexion 4 8  Ankle plantarflexion 43 heel 50 heel  Ankle inversion 20 pain lateral ankle 30  Ankle eversion 14 20   (Blank rows = not tested)  LOWER EXTREMITY MMT:  MMT Right eval Left eval  Hip flexion    Hip extension    Hip abduction    Hip adduction    Hip internal rotation    Hip external rotation    Knee flexion    Knee extension    Ankle dorsiflexion 5 5  Ankle plantarflexion Not able to complete SL PF 10+ reps  Ankle inversion 4+ 5  Ankle eversion 4+ 5   (Blank rows = not tested)  LOWER EXTREMITY SPECIAL TESTS:  Ankle special tests: NT  FUNCTIONAL TESTS:  5 times sit to stand: TBA 2 minute walk test: TBA SL stance : R 2", L +10"  GAIT: Distance walked: 200 Assistive device utilized: None Level of assistance: Complete Independence Comments: Decreased pace   TODAY'S TREATMENT:                                                                                                                               [facility] Adult PT Treatment:                                                DATE: 08/12/22 Therapeutic Exercise: Developed, instructed in, and pt completed therex as noted in HEP   PATIENT EDUCATION:  Education details: Eval findings, POC, HEP, self care- cold packs 10' for aggravation or moist heat pack 15' for mobility. Advised pt the alignment of her foot looks appropriate and to discontinue self-manipulations Person educated: Patient Education method: Explanation, Demonstration, Tactile cues, Verbal cues, and Handouts Education comprehension: verbalized understanding, returned demonstration, verbal cues required, and tactile cues required  HOME EXERCISE PROGRAM: Access Code: [email or web address] URL: [email or web address] Date: 08/12/2022 Prepared by: Joellyn Rued  Exercises - Gastroc Stretch on Wall  - 2 x daily - 7 x weekly - 1 sets - 3 reps - 20 hold - Soleus Stretch on Wall  - 2 x daily - 7 x  weekly - 1 sets - 3 reps - 20 hold - Seated Ankle Alphabet  - 1  x daily - 7 x weekly - 1 sets - 1 reps - Seated Toe Towel Scrunches  - 1 x daily - 7 x weekly - 1 sets - 1 reps - Toe Yoga - Alternating Great Toe and Lesser Toe Extension  - 1 x daily - 7 x weekly - 1 sets - 1 reps - Standing Single Leg Stance with Counter Support  - 1 x daily - 7 x weekly - 1 sets - 3-5 reps - 30 hold - Ankle and Toe Plantarflexion with Resistance  - 1 x daily - 7 x weekly - 3 sets - 10 reps - 3 hold  ASSESSMENT:  CLINICAL IMPRESSION: Patient is a 57 y.o. female who was seen today for physical therapy evaluation and treatment for M79.671 (ICD-10-CM) - Acute foot pain, right, M25.571 (ICD-10-CM) - Pain in joint involving right ankle and foot. Pt presents with a subacute injury to her R foot and ankle.  OBJECTIVE IMPAIRMENTS: decreased activity tolerance, decreased balance, difficulty walking, decreased ROM, decreased strength, increased edema, and pain.   ACTIVITY LIMITATIONS: bending, standing, squatting, stairs, and locomotion level  PARTICIPATION LIMITATIONS: meal prep, cleaning, laundry, interpersonal relationship, community activity, occupation, yard work, and walking for recreation  PERSONAL FACTORS: Past/current experiences and Time since onset of injury/illness/exacerbation are also affecting patient's functional outcome.   REHAB POTENTIAL: Good  CLINICAL DECISION MAKING: Stable/uncomplicated  EVALUATION COMPLEXITY: Low   GOALS:  SHORT TERM GOALS: Target date: 09/04/22 Pt will be Ind in an initial HEP  Baseline: started Goal status: INITIAL  2.  Pt will voice understanding of measures to assist in pain reduction  Baseline: started Goal status: INITIAL  LONG TERM GOALS: Target date: 10/02/22  Pt will be Ind in a final HEP to maintain achieved LOF  Baseline: started Goal status: INITIAL  2.  Improve 5xSTS by MCID of 5" and by MCID of 40ft as indication of improved functional  mobility Baseline: TBA Goal status: INITIAL  3.  Pt's FOTO score will improved to the predicted value of 53% as indication of improved function  Baseline: 43% Goal status: INITIAL  4.  Increase R single leg stance to 10" or greater for improved functional mobility Baseline: 2" Goal status: INITIAL  5.  Pt will report being able to tolerate walking 1 miles with 1 rest break as indication of improved R foot/ankle function Baselinevery limited  Goal status: INITIAL   PLAN:  PT FREQUENCY: 1x/week  PT DURATION: 6 weeks  PLANNED INTERVENTIONS: Therapeutic exercises, Therapeutic activity, Balance training, Gait training, Patient/Family education, Self Care, Joint mobilization, Aquatic Therapy, Dry Needling, Electrical stimulation, Cryotherapy, Moist heat, Taping, Vasopneumatic device, Ultrasound, Ionotophoresis 4mg /ml Dexamethasone, Manual therapy, and Re-evaluation  PLAN FOR NEXT SESSION: Review FOTO; assess response to HEP; progress therex as indicated; use of modalities, manual therapy; and TPDN as indicated.  Samra Pesch MS, PT 08/13/22 2:01 PM  PHYSICAL THERAPY DISCHARGE SUMMARY  Visits from Start of Care: 1  Current functional level related to goals / functional outcomes: Pt self DCed   Remaining deficits: Pt self DCed   Education / Equipment: HEP   Patient agrees to discharge. Patient goals were not met. Patient is being discharged due to not returning since the last visit.   Dharma Pare MS, PT 05/05/23 4:35 PM

## 2022-08-12 ENCOUNTER — Other Ambulatory Visit: Payer: Self-pay

## 2022-08-12 ENCOUNTER — Ambulatory Visit: Payer: Medicare Other | Attending: Sports Medicine

## 2022-08-12 DIAGNOSIS — M79671 Pain in right foot: Secondary | ICD-10-CM | POA: Insufficient documentation

## 2022-08-12 DIAGNOSIS — M6281 Muscle weakness (generalized): Secondary | ICD-10-CM | POA: Diagnosis present

## 2022-08-12 DIAGNOSIS — R262 Difficulty in walking, not elsewhere classified: Secondary | ICD-10-CM | POA: Diagnosis present

## 2022-08-12 DIAGNOSIS — M25571 Pain in right ankle and joints of right foot: Secondary | ICD-10-CM | POA: Insufficient documentation

## 2022-08-13 NOTE — Progress Notes (Signed)
Bianca [name] D.[facility] Phone: [phone]   Assessment and Plan:     1. Pain in joint involving right ankle and foot - Subacute, uncomplicated, subsequent visit 2. Chronic bilateral thoracic back pain 3. Somatic dysfunction of thoracic region 4. Somatic dysfunction of lumbar region 5. Somatic dysfunction of pelvic region 6. Somatic dysfunction of lower extremities 7. Somatic dysfunction of rib region  -Chronic with exacerbation, subsequent visit - Patient presents with multiple musculoskeletal complaints with most prominent being recurrent right foot and ankle pain and upper back pain.  Overall foot pain has improved after patient states she was able to pop it on her own, though had recurrence after a heavy walking day.  Typical recurrence of upper back pain - Patient has received significant relief with OMT in the past.  Elects for repeat OMT today.  Tolerated well per note below. - Decision today to treat with OMT was based on Physical Exam   After verbal consent patient was treated with HVLA (high velocity low amplitude), ME (muscle energy), FPR (flex positional release), ST (soft tissue), PC/PD (Pelvic Compression/ Pelvic Decompression) techniques in lower extremity, rib, thoracic, lumbar, and pelvic areas. Patient tolerated the procedure well with improvement in symptoms.  Patient educated on potential side effects of soreness and recommended to rest, hydrate, and use Tylenol as needed for pain control.   Pertinent previous records reviewed include none   Follow Up: As needed for reevaluation.  Could consider repeat OMT   Subjective:   I, Bianca [name], am serving as a Education administrator for Doctor Glennon Mac  Chief Complaint: OMT   HPI:  Bianca [name] is a 57 y.o. female coming in with complaint of back and neck pain. OMT on 11/27/2021. Patient states that she had increase in back pain due to moving a rug  approximately 2 weeks ago. Also slipped in bathroom last week and felt like her back twisted.    L arm and feet have been more numb. When her bp lowers her back pain will increase.  Patient still states that she has noticed as now after this last injury.  Sometimes can catch her and gives her difficulty with her breath.   Medications patient has been prescribed: None   Taking:   01/22/2022 Patient states that she is pretty mobile this week she is recovering a virus from this weekend sternum has popped several times this month her neck is gicing her some fits it feels like her head is crooked    04/02/2022 Bianca [name] is a 57 y.o. female coming in with complaint of back and neck pain. OMT 02/19/2022. Patient states that she is out of place in lumbar spine. Pain is better than it was when she first started as a patient here.     04/28/2022 Patient states that she is okay here for a tune up   07/09/2022 Patient states that she's here for adjustment , foot pain that she wants to discuss    07/14/2022 Bianca [name] is a 57 y.o. female coming in with complaint of back and neck pain. OMT on 04/02/2022. Follow up foot pain seen by Dr. Georgina Snell and Dr. Glennon Mac. Patient states twisted foot when she stepped in a hole, still hurting. Here for manipulation. No other concerns.    08/04/2022 Patient states same ole same , wants to focus on ankle and foot , states she has been popping it back in place    08/17/2022 Patient  states back and ankle need popping , she got her ankle to pop manually , but she did some yard work and its back out again....    Relevant Historical Information: History of cancer in remission for 3 years  Additional pertinent review of systems negative.  Current Outpatient Medications  Medication Sig Dispense Refill   aspirin EC 81 MG tablet Take 1 tablet (81 mg total) by mouth daily. Swallow whole. 30 tablet 11   beclomethasone (QVAR REDIHALER) 40 MCG/ACT inhaler Inhale 1 puff  into the lungs 2 (two) times daily. 1 each 0   bismuth subsalicylate (PEPTO BISMOL) 262 MG/15ML suspension Take 30 mLs by mouth daily as needed for indigestion or diarrhea or loose stools.     EPINEPHrine 0.3 mg/0.3 mL IJ SOAJ injection Inject 0.3 mg into the muscle once as needed for anaphylaxis.     levothyroxine (SYNTHROID) 137 MCG tablet Take 68.5 mcg by mouth daily.     No current facility-administered medications for this visit.      Objective:     Vitals:   08/17/22 1453  Pulse: (!) 101  SpO2: 96%  Weight: 156 lb (70.8 kg)  Height: 5\' 7"  (1.702 m)      Body mass index is 24.43 kg/m.    Physical Exam:     General: Well-appearing, cooperative, sitting comfortably in no acute distress.   OMT Physical Exam:  ASIS Compression Test: Positive left Lower extremity: TTP first cuneiform.  Full ankle ROM Rib: Right fourth rib stuck in inhalation, and improved after HVLA to thoracic spine Thoracic: TTP paraspinal, T4 RRSR, T11-L2 RRSL Lumbar: TTP paraspinal, T11-L2 RRSL Pelvis: Left anterior innominate  Electronically signed by:  Bianca [name] D.[name] 3:59 PM 08/17/22

## 2022-08-17 ENCOUNTER — Ambulatory Visit (INDEPENDENT_AMBULATORY_CARE_PROVIDER_SITE_OTHER): Payer: Medicare Other | Admitting: Sports Medicine

## 2022-08-17 VITALS — HR 101 | Ht 67.0 in | Wt 156.0 lb

## 2022-08-17 DIAGNOSIS — G8929 Other chronic pain: Secondary | ICD-10-CM | POA: Diagnosis not present

## 2022-08-17 DIAGNOSIS — M546 Pain in thoracic spine: Secondary | ICD-10-CM

## 2022-08-17 DIAGNOSIS — M9902 Segmental and somatic dysfunction of thoracic region: Secondary | ICD-10-CM

## 2022-08-17 DIAGNOSIS — M25571 Pain in right ankle and joints of right foot: Secondary | ICD-10-CM | POA: Diagnosis not present

## 2022-08-17 DIAGNOSIS — M9905 Segmental and somatic dysfunction of pelvic region: Secondary | ICD-10-CM

## 2022-08-17 DIAGNOSIS — M9906 Segmental and somatic dysfunction of lower extremity: Secondary | ICD-10-CM | POA: Diagnosis not present

## 2022-08-17 DIAGNOSIS — M9903 Segmental and somatic dysfunction of lumbar region: Secondary | ICD-10-CM | POA: Diagnosis not present

## 2022-08-17 DIAGNOSIS — M9908 Segmental and somatic dysfunction of rib cage: Secondary | ICD-10-CM

## 2022-08-17 NOTE — Patient Instructions (Signed)
Good to see you   

## 2022-08-24 ENCOUNTER — Ambulatory Visit: Payer: Medicare Other | Admitting: Family Medicine

## 2022-08-26 NOTE — Progress Notes (Unsigned)
[name] [address] [place] [address] Phone: [phone] Subjective:   Bianca [name], am serving as a scribe for Dr. Antoine Primas.  I'm seeing this patient by the request  of:  Arliss Journey, PA-C  CC: Back and neck pain  [id number]  Bianca [name] is a 57 y.o. female coming in with complaint of back and neck pain. OTM 07/14/2022. Patient states that she is here for her routine OMT and that her ankle is doing better with the PT.           Reviewed prior external information including notes and imaging from previsou exam, outside providers and external EMR if available.   As well as notes that were available from care everywhere and other healthcare systems.  Past medical history, social, surgical and family history all reviewed in electronic medical record.  No pertanent information unless stated regarding to the chief complaint.   Past Medical History:  Diagnosis Date   Allergy to alpha-gal    Thyroid disease     Allergies  Allergen Reactions   Atropine Sulfate Other (See Comments)    Low BP; numbness in both arms    Beef (Bovine) Protein Anaphylaxis    Alpha-gal syndrome    Beef-Derived Products Anaphylaxis    Alpha-gal syndrome    Gelatin Anaphylaxis    Alpha-gal syndrome    Heparin Anaphylaxis    Porcine heparin - Alpha-gal syndrome     Heparin (Bovine) Anaphylaxis    Alpha-gal syndrome   Milk (Cow) Anaphylaxis    Alpha-gal syndrome    Milk-Related Compounds Anaphylaxis    Alpha-gal syndrome    Pork-Derived Products Anaphylaxis    Alpha-gal syndrome    Sulfa Antibiotics Anaphylaxis   Prochlorperazine     Lowered BP; fainted    Shellfish Allergy Hives    Had reaction once to shrimp     Review of Systems:  No headache, visual changes, nausea, vomiting, diarrhea, constipation, dizziness, abdominal pain, skin rash, fevers, chills, night sweats, weight loss, swollen lymph nodes, body aches,  joint swelling, chest pain, shortness of breath, mood changes. POSITIVE muscle aches  Objective  Blood pressure 110/70, pulse 84, height 5\' 7"  (1.702 m), weight 153 lb (69.4 kg), SpO2 97 %.   General: No apparent distress alert and oriented x3 mood and affect normal, dressed appropriately.  HEENT: Pupils equal, extraocular movements intact  Respiratory: Patient's speak in full sentences and does not appear short of breath  Cardiovascular: No lower extremity edema, non tender, no erythema  Back pain does have some tenderness to palpation noted.  Patient does have tenderness in the parascapular area.  Still seems to be right greater than left  Osteopathic findings  C3 flexed rotated and side bent right C5 flexed rotated and side bent left T4 extended rotated and side bent right inhaled rib T5 extended rotated and side bent left inhaled. L1 flexed rotated and side bent right Sacrum right on right       Assessment and Plan:  Back pain Back exam does have some loss of lordosis still noted.  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  Did have a recent foot splint but is doing well at the moment.  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  Increase activity slowly.  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  we will continue to monitor but doing well.  Nonallopathic problems  Decision today to treat with OMT was based on Physical Exam  After verbal consent patient was treated with HVLA, ME, FPR techniques in cervical, rib, thoracic, lumbar, and sacral  areas  Patient tolerated the procedure well with improvement in symptoms  Patient given exercises, stretches and lifestyle modifications  See medications in patient instructions if given  Patient will follow up in 4-8 weeks     The above documentation has been reviewed and is accurate and complete Judi Saa, DO         Note: This dictation  was prepared with Dragon dictation along with smaller phrase technology. Any transcriptional errors that result from this process are unintentional.

## 2022-08-27 ENCOUNTER — Ambulatory Visit (INDEPENDENT_AMBULATORY_CARE_PROVIDER_SITE_OTHER): Payer: Medicare Other | Admitting: Family Medicine

## 2022-08-27 VITALS — BP 110/70 | HR 84 | Ht 67.0 in | Wt 153.0 lb

## 2022-08-27 DIAGNOSIS — G8929 Other chronic pain: Secondary | ICD-10-CM | POA: Diagnosis not present

## 2022-08-27 DIAGNOSIS — M545 Low back pain, unspecified: Secondary | ICD-10-CM

## 2022-08-27 DIAGNOSIS — M9902 Segmental and somatic dysfunction of thoracic region: Secondary | ICD-10-CM

## 2022-08-27 DIAGNOSIS — S93601D Unspecified sprain of right foot, subsequent encounter: Secondary | ICD-10-CM

## 2022-08-27 DIAGNOSIS — M9908 Segmental and somatic dysfunction of rib cage: Secondary | ICD-10-CM

## 2022-08-27 DIAGNOSIS — M9904 Segmental and somatic dysfunction of sacral region: Secondary | ICD-10-CM

## 2022-08-27 DIAGNOSIS — M9901 Segmental and somatic dysfunction of cervical region: Secondary | ICD-10-CM | POA: Diagnosis not present

## 2022-08-27 NOTE — Patient Instructions (Signed)
Good to see you  [facility] Doing really well See me again in 2 months

## 2022-08-27 NOTE — Assessment & Plan Note (Signed)
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  we will continue to monitor but doing well.

## 2022-08-27 NOTE — Assessment & Plan Note (Signed)
Back exam does have some loss of lordosis still noted.  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  Did have a recent foot splint but is doing well at the moment.  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  Increase activity slowly.

## 2022-08-29 IMAGING — MR MR HEAD WO/W CM
14 of 16 series · 42 of 48 positions shown · IV contrast (gadavist)
Comparison: No pertinent prior exam.

CLINICAL DATA: Vision loss and numbness

EXAM:
MRI HEAD WITHOUT AND WITH CONTRAST
MRA HEAD WITHOUT CONTRAST
TECHNIQUE: Multiplanar, multi-echo pulse sequences of the brain and surrounding
structures were acquired without and with intravenous contrast.
Angiographic images of the Circle of Willis were acquired using MRA
technique without intravenous contrast.
CONTRAST:  6mL GADAVIST GADOBUTROL 1 MMOL/ML IV SOLN

[Series 5: DWI · axial · 3.0mm · 0.88mm/px · z∈[-93,+65]mm · 8 of 108 slices shown (1 of 4)]
[im 1/108]
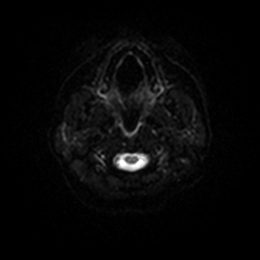
[im 16/108]
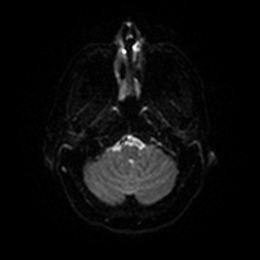
[im 31/108]
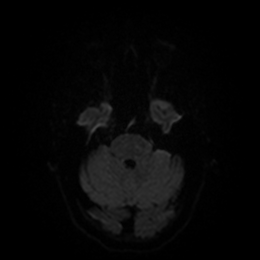
[im 46/108]
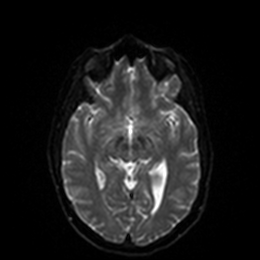
[im 62/108]
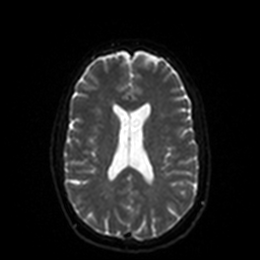
[im 77/108]
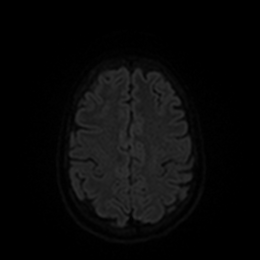
[im 92/108]
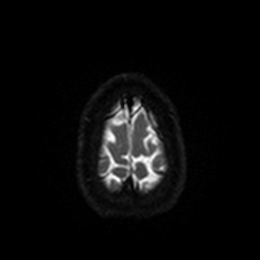
[im 108/108]
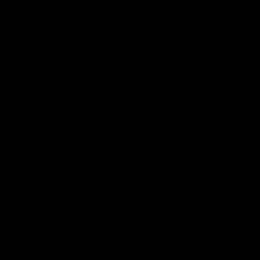

[Series 6: DWI · axial · 3.0mm · 0.88mm/px · z∈[-93,+59]mm · 3 of 52 slices shown (2 of 4)]
[im 1/52]
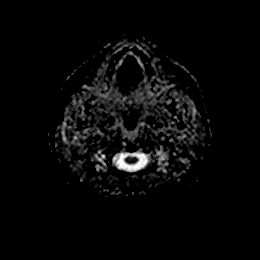
[im 26/52]
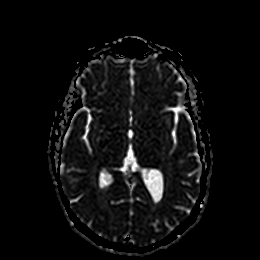
[im 52/52]
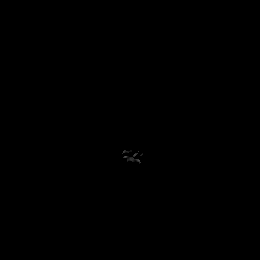

[Series 7: DWI · coronal · 4.0mm · 0.88mm/px · 5 of 80 slices shown (3 of 4)]
[im 1/80]
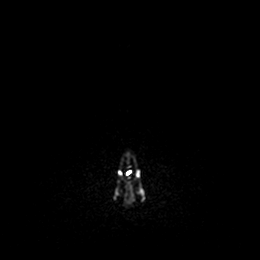
[im 20/80]
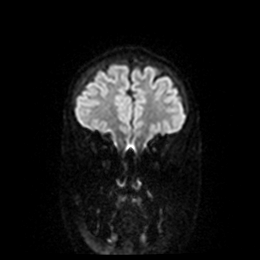
[im 40/80]
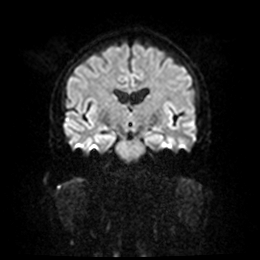
[im 60/80]
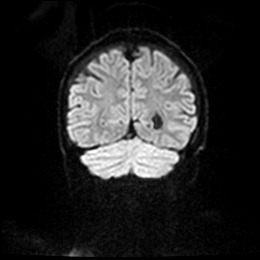
[im 80/80]
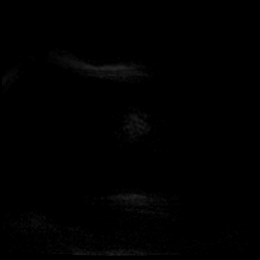

[Series 8: DWI · coronal · 4.0mm · 0.88mm/px · 2 of 40 slices shown (4 of 4)]
[im 1/40]
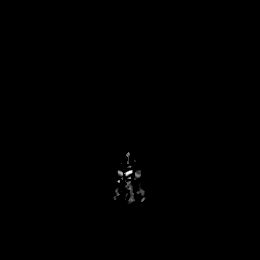
[im 40/40]
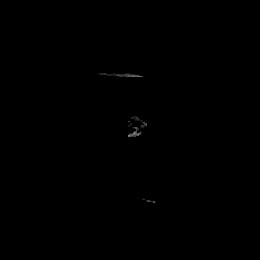

[Series 9: T1 · sagittal · 5.0mm · 0.75mm/px · 2 of 26 slices shown]
[im 1/26]
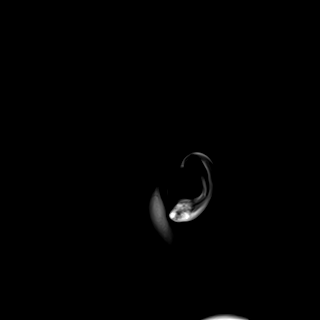
[im 26/26]
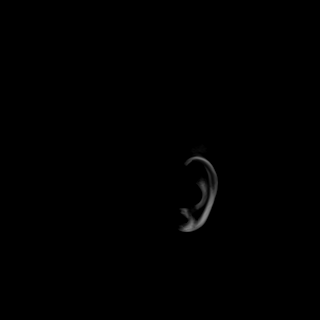

[Series 10: T2 · axial · 5.0mm · 0.72mm/px · z∈[-96,+61]mm · 2 of 28 slices shown]
[im 1/28]
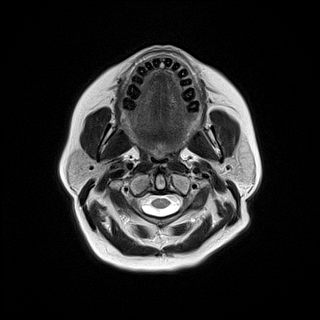
[im 28/28]
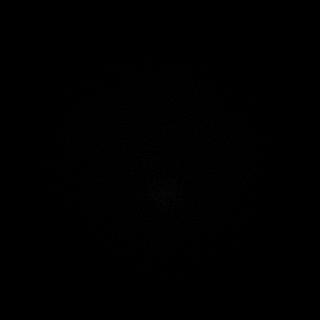

[Series 11: FLAIR · axial · 5.0mm · 0.45mm/px · z∈[-95,+62]mm · 2 of 28 slices shown]
[im 1/28]
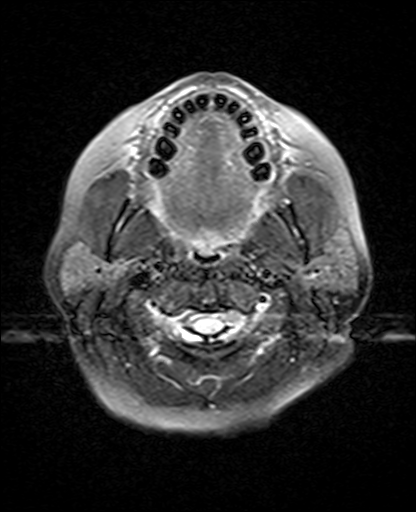
[im 28/28]
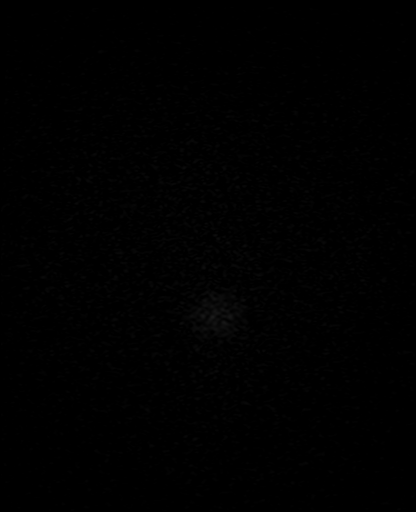

[Series 12: mag_images · axial · 3.0mm · 0.90mm/px · z∈[-99,+62]mm · 3 of 56 slices shown]
[im 1/56]
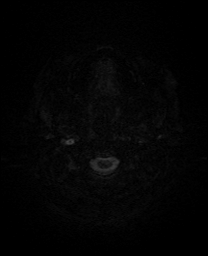
[im 28/56]
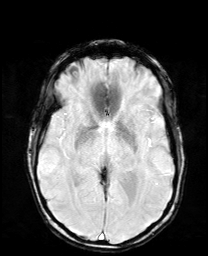
[im 56/56]
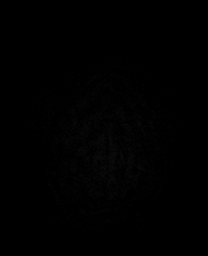

[Series 13: pha_images · axial · 3.0mm · 0.90mm/px · z∈[-99,+53]mm · 3 of 53 slices shown]
[im 1/53]
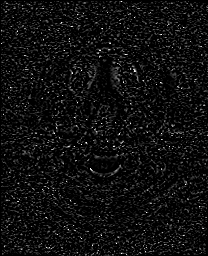
[im 27/53]
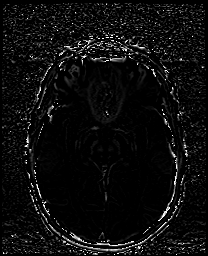
[im 53/53]
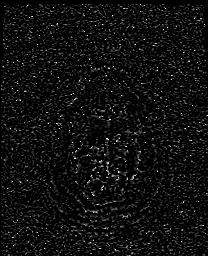

[Series 14: swi_images · axial · 3.0mm · 0.90mm/px · z∈[-99,+62]mm · 3 of 56 slices shown]
[im 1/56]
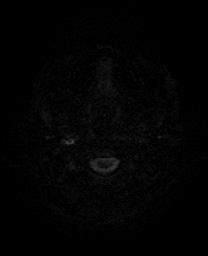
[im 28/56]
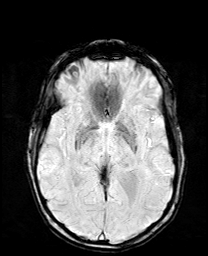
[im 56/56]
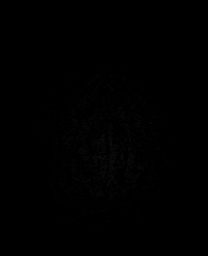

[Series 15: mip_images(sw) · axial · 24.0mm · 0.90mm/px · z∈[-88,+52]mm · 3 of 49 slices shown]
[im 1/49]
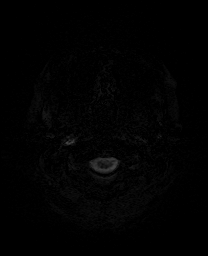
[im 25/49]
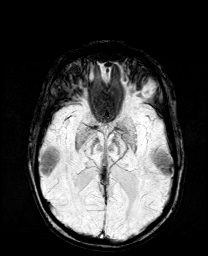
[im 49/49]
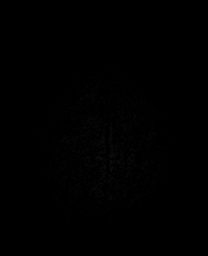

[Series 18: T2 post-contrast · coronal · 5.0mm · 0.72mm/px · 2 of 33 slices shown]
[im 1/33]
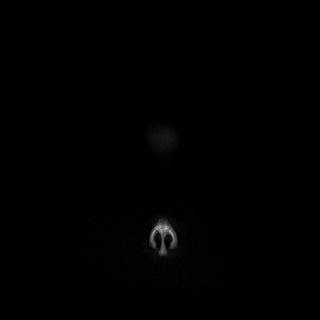
[im 33/33]
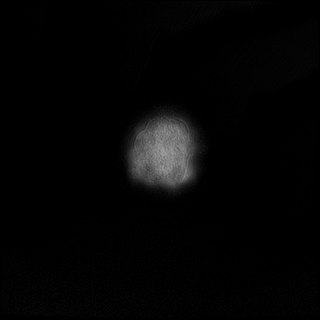

[Series 20: T1 post-contrast · coronal · 5.0mm · 0.34mm/px · 2 of 32 slices shown (1 of 2)]
[im 1/32]
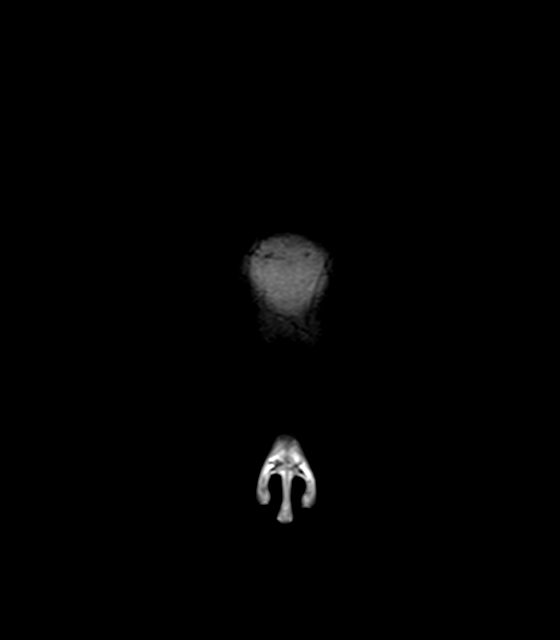
[im 32/32]
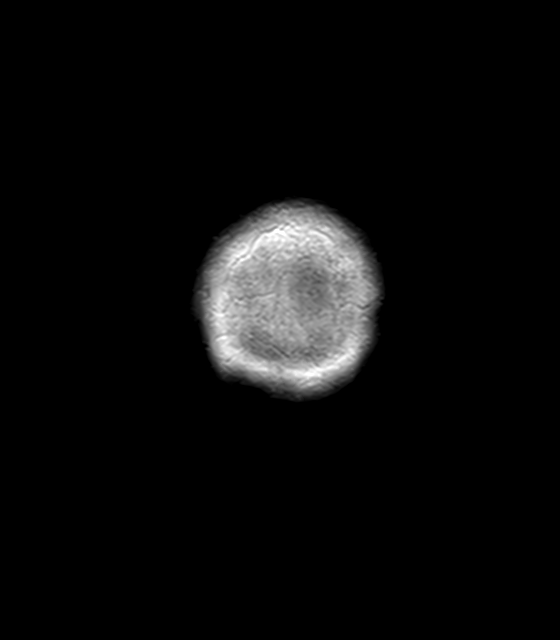

[Series 21: T1 post-contrast · sagittal · 5.0mm · 0.72mm/px · 2 of 26 slices shown (2 of 2)]
[im 1/26]
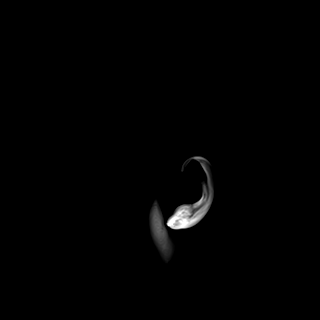
[im 26/26]
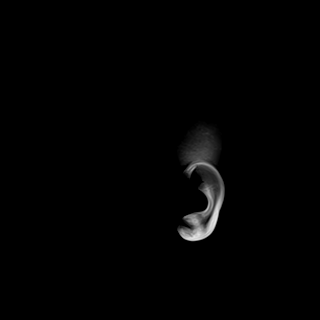

[42 of 48 positions shown; findings below may reference images not displayed]

FINDINGS: MRI HEAD FINDINGS

Brain: No acute infarct, mass effect or extra-axial collection. No
acute or chronic hemorrhage. Normal white matter signal, parenchymal
volume and CSF spaces. The midline structures are normal. There is
no abnormal contrast enhancement.

Vascular: Major flow voids are preserved.

Skull and upper cervical spine: Normal calvarium and skull base.
Visualized upper cervical spine and soft tissues are normal.

Sinuses/Orbits:No paranasal sinus fluid levels or advanced mucosal
thickening. No mastoid or middle ear effusion. Normal orbits.

MRA HEAD FINDINGS

POSTERIOR CIRCULATION:

--Vertebral arteries: Normal

--Inferior cerebellar arteries: Normal.

--Basilar artery: Normal.

--Superior cerebellar arteries: Normal.

--Posterior cerebral arteries: Normal.

ANTERIOR CIRCULATION:

--Intracranial internal carotid arteries: Normal.

--Anterior cerebral arteries (ACA): Normal.

--Middle cerebral arteries (MCA): Normal.

ANATOMIC VARIANTS: Both posterior communicating arteries are patent
IMPRESSION: Normal MRI and MRA of the brain.

## 2022-09-01 ENCOUNTER — Ambulatory Visit: Payer: Medicare Other

## 2022-09-07 ENCOUNTER — Ambulatory Visit: Payer: Medicare Other | Admitting: Physical Therapy

## 2022-09-10 NOTE — Progress Notes (Signed)
Aleen Sells D.[facility] [address] [place] [address] Phone: [phone]   Assessment and Plan:     1. Chronic bilateral low back pain without sciatica 2. Chronic bilateral thoracic back pain 3. Acute pain of left shoulder 4. Somatic dysfunction of thoracic region 5. Somatic dysfunction of lumbar region 6. Somatic dysfunction of pelvic region 7. Somatic dysfunction of rib region 8. Somatic dysfunction of upper extremities  -Chronic with exacerbation, subsequent visit - Recurrence of multiple musculoskeletal complaints with most prominent being in upper back, lower back, left hip, left shoulder.  Patient has had moderate relief with physical therapy and taping of ankle - Patient has received significant relief with OMT in the past.  Elects for repeat OMT today.  Tolerated well per note below. - Decision today to treat with OMT was based on Physical Exam   After verbal consent patient was treated with HVLA (high velocity low amplitude), ME (muscle energy), FPR (flex positional release), ST (soft tissue), PC/PD (Pelvic Compression/ Pelvic Decompression) techniques in upper extremity, rib, thoracic, lumbar, and pelvic areas. Patient tolerated the procedure well with improvement in symptoms.  Patient educated on potential side effects of soreness and recommended to rest, hydrate, and use Tylenol as needed for pain control.   Pertinent previous records reviewed include none   Follow Up: As needed for repeat OMT and reevaluation   Subjective:   I, [name], am serving as a Neurosurgeon for Doctor Richardean Sale  Chief Complaint: OMT   HPI:  Jessamy Torosyan is a 57 y.o. female coming in with complaint of back and neck pain. OMT on 11/27/2021. Patient states that she had increase in back pain due to moving a rug approximately 2 weeks ago. Also slipped in bathroom last week and felt like her back twisted.    L arm and feet have been more numb. When her bp  lowers her back pain will increase.  Patient still states that she has noticed as now after this last injury.  Sometimes can catch her and gives her difficulty with her breath.   Medications patient has been prescribed: None   Taking:   01/22/2022 Patient states that she is pretty mobile this week she is recovering a virus from this weekend sternum has popped several times this month her neck is gicing her some fits it feels like her head is crooked    04/02/2022 Jenascia Bumpass is a 57 y.o. female coming in with complaint of back and neck pain. OMT 02/19/2022. Patient states that she is out of place in lumbar spine. Pain is better than it was when she first started as a patient here.     04/28/2022 Patient states that she is okay here for a tune up   07/09/2022 Patient states that she's here for adjustment , foot pain that she wants to discuss    07/14/2022 Matia Zelada is a 57 y.o. female coming in with complaint of back and neck pain. OMT on 04/02/2022. Follow up foot pain seen by Dr. Denyse Amass and Dr. Jean Rosenthal. Patient states twisted foot when she stepped in a hole, still hurting. Here for manipulation. No other concerns.    08/04/2022 Patient states same ole same , wants to focus on ankle and foot , states she has been popping it back in place    08/17/2022 Patient states back and ankle need popping , she got her ankle to pop manually , but she did some yard work and its back  out again....   08/27/2022 Alohilani Levenhagen is a 57 y.o. female coming in with complaint of back and neck pain. OTM 07/14/2022. Patient states that she is here for her routine OMT and that her ankle is doing better with the PT.    09/11/2022 Patient states that foot is getting better, back is tight she lifted and turned and thinks her back was popped out of place      Relevant Historical Information: History of cancer in remission for 3 years  Additional pertinent review of systems negative.  Current Outpatient  Medications  Medication Sig Dispense Refill   beclomethasone (QVAR REDIHALER) 40 MCG/ACT inhaler Inhale 1 puff into the lungs 2 (two) times daily. 1 each 0   bismuth subsalicylate (PEPTO BISMOL) 262 MG/15ML suspension Take 30 mLs by mouth daily as needed for indigestion or diarrhea or loose stools.     EPINEPHrine 0.3 mg/0.3 mL IJ SOAJ injection Inject 0.3 mg into the muscle once as needed for anaphylaxis.     levothyroxine (SYNTHROID) 137 MCG tablet Take 68.5 mcg by mouth daily.     No current facility-administered medications for this visit.      Objective:     Vitals:   09/11/22 1309  Pulse: 80  SpO2: 99%  Weight: 157 lb (71.2 kg)  Height: 5\' 7"  (1.702 m)      Body mass index is 24.59 kg/m.    Physical Exam:     General: Well-appearing, cooperative, sitting comfortably in no acute distress.   OMT Physical Exam:  ASIS Compression Test: Positive left Upper extremity: Restricted left shoulder ROM in flexion and abduction.  Spencers technique performed Rib: Left elevated first rib with TTP Thoracic: TTP paraspinal, T6 RRSR Lumbar: TTP paraspinal, L2 RLSL Pelvis: Left anterior innominate  Electronically signed by:  Aleen Sells D.[facility] 1:33 PM 09/11/22

## 2022-09-11 ENCOUNTER — Ambulatory Visit: Payer: Medicare Other | Admitting: Sports Medicine

## 2022-09-11 VITALS — HR 80 | Ht 67.0 in | Wt 157.0 lb

## 2022-09-11 DIAGNOSIS — M9907 Segmental and somatic dysfunction of upper extremity: Secondary | ICD-10-CM

## 2022-09-11 DIAGNOSIS — G8929 Other chronic pain: Secondary | ICD-10-CM | POA: Diagnosis not present

## 2022-09-11 DIAGNOSIS — M545 Low back pain, unspecified: Secondary | ICD-10-CM | POA: Diagnosis not present

## 2022-09-11 DIAGNOSIS — M546 Pain in thoracic spine: Secondary | ICD-10-CM

## 2022-09-11 DIAGNOSIS — M9905 Segmental and somatic dysfunction of pelvic region: Secondary | ICD-10-CM

## 2022-09-11 DIAGNOSIS — M9902 Segmental and somatic dysfunction of thoracic region: Secondary | ICD-10-CM

## 2022-09-11 DIAGNOSIS — M25512 Pain in left shoulder: Secondary | ICD-10-CM

## 2022-09-11 DIAGNOSIS — M9903 Segmental and somatic dysfunction of lumbar region: Secondary | ICD-10-CM

## 2022-09-11 DIAGNOSIS — M9908 Segmental and somatic dysfunction of rib cage: Secondary | ICD-10-CM

## 2022-09-11 NOTE — Patient Instructions (Signed)
Good to see you   

## 2022-09-14 ENCOUNTER — Ambulatory Visit: Payer: Medicare Other | Admitting: Physical Therapy

## 2022-09-24 ENCOUNTER — Ambulatory Visit: Payer: Medicare Other | Admitting: Family Medicine

## 2022-10-13 NOTE — Progress Notes (Unsigned)
[name] [address] [place] [address] Phone: [phone] Subjective:   INadine Counts, am serving as a scribe for Dr. Antoine Primas.  I'm seeing this patient by the request  of:  Arliss Journey, PA-C  CC: Back and neck pain follow-up  [id number]  Bianca [name] is a 57 y.o. female coming in with complaint of back and neck pain. OMT on 08/27/2022. Also seen for ankle pain. Patient states same per usual. No new concerns.  Has had some more tightness recently.  Was sick and unfortunately hospitalized.  Still just feels like she has not got her energy back yet.  Continues on vitamin supplementation.  Medications patient has been prescribed:   Taking:         Reviewed prior external information including notes and imaging from previsou exam, outside providers and external EMR if available.   As well as notes that were available from care everywhere and other healthcare systems.  Past medical history, social, surgical and family history all reviewed in electronic medical record.  No pertanent information unless stated regarding to the chief complaint.   Past Medical History:  Diagnosis Date   Allergy to alpha-gal    Thyroid disease     Allergies  Allergen Reactions   Atropine Sulfate Other (See Comments)    Low BP; numbness in both arms    Beef (Bovine) Protein Anaphylaxis    Alpha-gal syndrome    Beef-Derived Products Anaphylaxis    Alpha-gal syndrome    Gelatin Anaphylaxis    Alpha-gal syndrome    Heparin Anaphylaxis    Porcine heparin - Alpha-gal syndrome     Heparin (Bovine) Anaphylaxis    Alpha-gal syndrome   Milk (Cow) Anaphylaxis    Alpha-gal syndrome    Milk-Related Compounds Anaphylaxis    Alpha-gal syndrome    Pork-Derived Products Anaphylaxis    Alpha-gal syndrome    Sulfa Antibiotics Anaphylaxis   Prochlorperazine     Lowered BP; fainted    Shellfish Allergy Hives    Had reaction once to shrimp      Review of Systems:  No headache, visual changes, nausea, vomiting, diarrhea, constipation, dizziness, abdominal pain, skin rash, fevers, chills, night sweats, weight loss, swollen lymph nodes, body aches, joint swelling, chest pain, shortness of breath, mood changes. POSITIVE muscle aches  Objective  Blood pressure 110/62, pulse 70, height 5\' 7"  (1.702 m), weight 156 lb (70.8 kg), SpO2 96 %.   General: No apparent distress alert and oriented x3 mood and affect normal, dressed appropriately.  HEENT: Pupils equal, extraocular movements intact  Respiratory: Patient's speak in full sentences and does not appear short of breath  Cardiovascular: No lower extremity edema, non tender, no erythema  MSK:  Low back exam does have some loss of lordosis noted.  Patient does still have tightness noted in the parascapular area bilaterally.  Potential slipped rib syndrome still.  Osteopathic findings  C2 flexed rotated and side bent right C6 flexed rotated and side bent right T3 extended rotated and side bent right inhaled rib T5 extended rotated and side bent left inhaled rib L2 flexed rotated and side bent right Sacrum right on right     Assessment and Plan:  Right foot sprain Patient seems to be resolved at this time.  No other changes.  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  Seem to be more secondary to positioning still.  Patient was not doing much better but recently  did have a death of a friend and has not been as active.  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  Patient is doing better but feels like the fatigue did cause more difficulty.  Will follow-up with other providers as well for this.  No worsening abdominal pain or anything that would be concerning at the moment.  Follow-up again in 2 months.    Nonallopathic problems  Decision today to treat with OMT was based on Physical Exam  After verbal consent patient  was treated with HVLA, ME, FPR techniques in cervical, rib, thoracic, lumbar, and sacral  areas  Patient tolerated the procedure well with improvement in symptoms  Patient given exercises, stretches and lifestyle modifications  See medications in patient instructions if given  Patient will follow up in 4-8 weeks     The above documentation has been reviewed and is accurate and complete Judi Saa, DO         Note: This dictation was prepared with Dragon dictation along with smaller phrase technology. Any transcriptional errors that result from this process are unintentional.

## 2022-10-15 ENCOUNTER — Ambulatory Visit: Payer: Medicare Other | Admitting: Family Medicine

## 2022-10-15 VITALS — BP 110/62 | HR 70 | Ht 67.0 in | Wt 156.0 lb

## 2022-10-15 DIAGNOSIS — M9908 Segmental and somatic dysfunction of rib cage: Secondary | ICD-10-CM

## 2022-10-15 DIAGNOSIS — S93601D Unspecified sprain of right foot, subsequent encounter: Secondary | ICD-10-CM

## 2022-10-15 DIAGNOSIS — M9901 Segmental and somatic dysfunction of cervical region: Secondary | ICD-10-CM | POA: Diagnosis not present

## 2022-10-15 DIAGNOSIS — M9902 Segmental and somatic dysfunction of thoracic region: Secondary | ICD-10-CM

## 2022-10-15 DIAGNOSIS — M9903 Segmental and somatic dysfunction of lumbar region: Secondary | ICD-10-CM

## 2022-10-15 DIAGNOSIS — M9904 Segmental and somatic dysfunction of sacral region: Secondary | ICD-10-CM | POA: Diagnosis not present

## 2022-10-15 DIAGNOSIS — J986 Disorders of diaphragm: Secondary | ICD-10-CM | POA: Diagnosis not present

## 2022-10-15 NOTE — Patient Instructions (Signed)
Good to see you! Sorry for your loss I think I need to give yourself more credit See you again in 2 months

## 2022-10-15 NOTE — Assessment & Plan Note (Signed)
Continues to do better overall though at this moment.  Seem to be more secondary to positioning still.  Patient was not doing much better but recently did have a death of a friend and has not been as active.  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  Patient is doing better but feels like the fatigue did cause more difficulty.  Will follow-up with other providers as well for this.  No worsening abdominal pain or anything that would be concerning at the moment.  Follow-up again in 2 months.

## 2022-10-15 NOTE — Assessment & Plan Note (Signed)
Patient seems to be resolved at this time.  No other changes.

## 2022-11-27 NOTE — Progress Notes (Signed)
Bianca [name] [address] [place] [address] Phone: [phone] Subjective:   Bianca [name], am serving as a scribe for Dr. Antoine Primas.  I'm seeing this patient by the request  of:  Arliss Journey, PA-C  CC: back and neck pain follow up   [id number]  Bianca [name] is a 57 y.o. female coming in with complaint of back and neck pain. OMT on 10/15/2022. Also seen for foot pain. Patient states here for routine OMT, but she mentioned to her PT that her arms and hands were having some numbness and then she moved a certain way felt a pop and the numbness went away.  Medications patient has been prescribed:   Taking:         Reviewed prior external information including notes and imaging from previsou exam, outside providers and external EMR if available.   As well as notes that were available from care everywhere and other healthcare systems.  Past medical history, social, surgical and family history all reviewed in electronic medical record.  No pertanent information unless stated regarding to the chief complaint.   Past Medical History:  Diagnosis Date   Allergy to alpha-gal    Thyroid disease     Allergies  Allergen Reactions   Atropine Sulfate Other (See Comments)    Low BP; numbness in both arms    Beef (Bovine) Protein Anaphylaxis    Alpha-gal syndrome    Beef-Derived Products Anaphylaxis    Alpha-gal syndrome    Gelatin Anaphylaxis    Alpha-gal syndrome    Heparin Anaphylaxis    Porcine heparin - Alpha-gal syndrome     Heparin (Bovine) Anaphylaxis    Alpha-gal syndrome   Milk (Cow) Anaphylaxis    Alpha-gal syndrome    Milk-Related Compounds Anaphylaxis    Alpha-gal syndrome    Pork-Derived Products Anaphylaxis    Alpha-gal syndrome    Sulfa Antibiotics Anaphylaxis   Prochlorperazine     Lowered BP; fainted    Shellfish Allergy Hives    Had reaction once to shrimp     Review of Systems:  No  headache, visual changes, nausea, vomiting, diarrhea, constipation, dizziness, , skin rash, fevers, chills, night sweats, weight loss, swollen lymph nodesjoint swelling, chest pain, shortness of breath, mood changes. POSITIVE muscle aches, abdominal pain, body aches  Objective  Blood pressure 110/70, pulse 77, height 5\' 7"  (1.702 m), weight 154 lb (69.9 kg), SpO2 97%.   General: No apparent distress alert and oriented x3 mood and affect normal, dressed appropriately.  HEENT: Pupils equal, extraocular movements intact  Respiratory: Patient's speak in full sentences and does not appear short of breath  Cardiovascular: No lower extremity edema, non tender, no erythema  MSK:  Back does have some loss lordosis noted.  Some tenderness to palpation of the paraspinal musculature.  Seems to be in the thoracolumbar juncture but also over the sacroiliac bilaterally.  Osteopathic findings  C2 flexed rotated and side bent right C6 flexed rotated and side bent left T3 extended rotated and side bent right inhaled rib T9 extended rotated and side bent left L1 flexed rotated and side bent right L3 flexed rotated and side bent left Sacrum right on right       Assessment and Plan:  Back pain Chronic multifactorial back pain.  Still doing better than the patient was multiple months ago.  Encouraged her to continue to try to stay active.  Has had difficulty and I think at this moment may  be feeling a little under the weather.  Discussed icing regimen and home exercises.  Still responding well to osteopathic manipulation.  Discussed with patient to continue to follow-up with her other specialist.  Has a another CT scan scheduled in the relatively near future.    Nonallopathic problems  Decision today to treat with OMT was based on Physical Exam  After verbal consent patient was treated with HVLA, ME, FPR techniques in cervical, rib, thoracic, lumbar, and sacral  areas  Patient tolerated the procedure  well with improvement in symptoms  Patient given exercises, stretches and lifestyle modifications  See medications in patient instructions if given  Patient will follow up in 4-8 weeks   The above documentation has been reviewed and is accurate and complete Judi Saa, DO          Note: This dictation was prepared with Dragon dictation along with smaller phrase technology. Any transcriptional errors that result from this process are unintentional.

## 2022-12-01 ENCOUNTER — Ambulatory Visit: Payer: Medicare Other | Admitting: Family Medicine

## 2022-12-01 VITALS — BP 110/70 | HR 77 | Ht 67.0 in | Wt 154.0 lb

## 2022-12-01 DIAGNOSIS — M9902 Segmental and somatic dysfunction of thoracic region: Secondary | ICD-10-CM

## 2022-12-01 DIAGNOSIS — M9904 Segmental and somatic dysfunction of sacral region: Secondary | ICD-10-CM | POA: Diagnosis not present

## 2022-12-01 DIAGNOSIS — M9903 Segmental and somatic dysfunction of lumbar region: Secondary | ICD-10-CM | POA: Diagnosis not present

## 2022-12-01 DIAGNOSIS — M545 Low back pain, unspecified: Secondary | ICD-10-CM

## 2022-12-01 DIAGNOSIS — M9908 Segmental and somatic dysfunction of rib cage: Secondary | ICD-10-CM

## 2022-12-01 DIAGNOSIS — G8929 Other chronic pain: Secondary | ICD-10-CM

## 2022-12-01 DIAGNOSIS — M9901 Segmental and somatic dysfunction of cervical region: Secondary | ICD-10-CM

## 2022-12-01 NOTE — Patient Instructions (Addendum)
Good to see you  Take the Cipro when you get home  Follow up in 5 weeks

## 2022-12-01 NOTE — Assessment & Plan Note (Signed)
Chronic multifactorial back pain.  Still doing better than the patient was multiple months ago.  Encouraged her to continue to try to stay active.  Has had difficulty and I think at this moment may be feeling a little under the weather.  Discussed icing regimen and home exercises.  Still responding well to osteopathic manipulation.  Discussed with patient to continue to follow-up with her other specialist.  Has a another CT scan scheduled in the relatively near future.

## 2022-12-16 ENCOUNTER — Ambulatory Visit: Payer: Medicare Other | Admitting: Family Medicine

## 2022-12-22 ENCOUNTER — Ambulatory Visit: Payer: Medicare Other | Admitting: Family Medicine
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
## 2023-01-07 ENCOUNTER — Other Ambulatory Visit: Payer: Self-pay

## 2023-01-07 ENCOUNTER — Encounter: Payer: Self-pay | Admitting: Family Medicine

## 2023-01-07 ENCOUNTER — Ambulatory Visit (INDEPENDENT_AMBULATORY_CARE_PROVIDER_SITE_OTHER): Payer: Medicare Other | Admitting: Family Medicine

## 2023-01-07 VITALS — BP 102/62 | HR 65 | Ht 67.0 in | Wt 156.0 lb

## 2023-01-07 DIAGNOSIS — M9902 Segmental and somatic dysfunction of thoracic region: Secondary | ICD-10-CM | POA: Diagnosis not present

## 2023-01-07 DIAGNOSIS — M9901 Segmental and somatic dysfunction of cervical region: Secondary | ICD-10-CM

## 2023-01-07 DIAGNOSIS — G8929 Other chronic pain: Secondary | ICD-10-CM

## 2023-01-07 DIAGNOSIS — M9904 Segmental and somatic dysfunction of sacral region: Secondary | ICD-10-CM | POA: Diagnosis not present

## 2023-01-07 DIAGNOSIS — M9903 Segmental and somatic dysfunction of lumbar region: Secondary | ICD-10-CM

## 2023-01-07 DIAGNOSIS — M9908 Segmental and somatic dysfunction of rib cage: Secondary | ICD-10-CM

## 2023-01-07 DIAGNOSIS — M545 Low back pain, unspecified: Secondary | ICD-10-CM | POA: Diagnosis not present

## 2023-01-07 MED ORDER — QVAR REDIHALER 40 MCG/ACT IN AERB: 1.0000 | INHALATION_SPRAY | Freq: Two times a day (BID) | RESPIRATORY_TRACT | 0 refills | Status: AC

## 2023-01-07 NOTE — Assessment & Plan Note (Signed)
Chronic problem but doing well overall. Patient has been able to be active but had exacerbation likely secondary to the weather and having to do more yard work.  Discussed with patient about icing regimen and home exercises.  Discussed which activities to do and which ones to avoid.  Patient will continue to monitor and be active otherwise.  Follow-up again in 6 to 8 weeks

## 2023-01-07 NOTE — Patient Instructions (Addendum)
Be proud I hope the housework goes well See me in 8 weeks

## 2023-01-13 ENCOUNTER — Ambulatory Visit: Payer: Medicare Other | Admitting: Family Medicine

## 2023-02-10 NOTE — Progress Notes (Unsigned)
[name] [address] [place] [address] Phone: [phone] Subjective:   Bianca [name], am serving as a scribe for Dr. Antoine Primas.  I'm seeing this patient by the request  of:  Arliss Journey, PA-C  CC: Neck and back pain follow-up  [id number]  Bianca [name] is a 57 y.o. female coming in with complaint of back and neck pain. OMT 01/07/2023. Patient states that she is having stiffness in neck and L shoulder. R ankle has shifted back out of place.   Medications patient has been prescribed: QVAR  Taking:         Reviewed prior external information including notes and imaging from previsou exam, outside providers and external EMR if available.   As well as notes that were available from care everywhere and other healthcare systems.  Past medical history, social, surgical and family history all reviewed in electronic medical record.  No pertanent information unless stated regarding to the chief complaint.   Past Medical History:  Diagnosis Date   Allergy to alpha-gal    Thyroid disease     Allergies  Allergen Reactions   Atropine Sulfate Other (See Comments)    Low BP; numbness in both arms    Beef (Bovine) Protein Anaphylaxis    Alpha-gal syndrome    Beef-Derived Products Anaphylaxis    Alpha-gal syndrome    Gelatin Anaphylaxis    Alpha-gal syndrome    Heparin Anaphylaxis    Porcine heparin - Alpha-gal syndrome     Heparin (Bovine) Anaphylaxis    Alpha-gal syndrome   Milk (Cow) Anaphylaxis    Alpha-gal syndrome    Milk-Related Compounds Anaphylaxis    Alpha-gal syndrome    Pork-Derived Products Anaphylaxis    Alpha-gal syndrome    Sulfa Antibiotics Anaphylaxis   Prochlorperazine     Lowered BP; fainted    Shellfish Allergy Hives    Had reaction once to shrimp     Review of Systems:  No headache, visual changes, nausea, vomiting, diarrhea, constipation, dizziness, abdominal pain, skin rash,  fevers, chills, night sweats, weight loss, swollen lymph nodes, body aches, joint swelling, chest pain, shortness of breath, mood changes. POSITIVE muscle aches  Objective  Blood pressure 112/74, pulse (!) 58, height 5\' 7"  (1.702 m), weight 155 lb (70.3 kg), SpO2 97%.   General: No apparent distress alert and oriented x3 mood and affect normal, dressed appropriately.  HEENT: Pupils equal, extraocular movements intact  Respiratory: Patient's speak in full sentences and does not appear short of breath  Cardiovascular: No lower extremity edema, non tender, no erythema  Neck exam does have some loss lordosis noted.  Some tenderness to palpation of the paraspinal musculature.  Osteopathic findings  C6 flexed rotated and side bent left T3 extended rotated and side bent right inhaled rib T9 extended rotated and side bent left L5 flexed rotated and side bent right Sacrum right on right       Assessment and Plan:  Back pain Back exam still has some loss lordosis but is responding relatively well to osteopathic manipulation.  Discussed which activities to do and which ones to avoid.  Increase activity s.  Lowly.  Follow-up again in 6 to 8 weeks otherwise.  No changes in medications.  Patient will continue to work on core strengthening.  Likely patient has been able to start increasing some of her activities and seems to be enjoying life  Right foot sprain Continuing to work with physical therapy.  Discussed about  potentially stretching and strengthening of the lateral column as well as stretching the posterior tibialis tendon.  I do think that this is more secondary to difficulties.  Patient will follow-up with me again in 6 to 8 weeks.    Nonallopathic problems  Decision today to treat with OMT was based on Physical Exam  After verbal consent patient was treated with HVLA, ME, FPR techniques in cervical, rib, thoracic, lumbar, and sacral  areas  Patient tolerated the procedure well with  improvement in symptoms  Patient given exercises, stretches and lifestyle modifications  See medications in patient instructions if given  Patient will follow up in 4-8 weeks Total time with patient 31 minutes    The above documentation has been reviewed and is accurate and complete Judi Saa, DO         Note: This dictation was prepared with Dragon dictation along with smaller phrase technology. Any transcriptional errors that result from this process are unintentional.

## 2023-02-11 ENCOUNTER — Ambulatory Visit: Payer: Medicare Other | Admitting: Family Medicine

## 2023-02-11 ENCOUNTER — Encounter: Payer: Self-pay | Admitting: Family Medicine

## 2023-02-11 VITALS — BP 112/74 | HR 58 | Ht 67.0 in | Wt 155.0 lb

## 2023-02-11 DIAGNOSIS — M9903 Segmental and somatic dysfunction of lumbar region: Secondary | ICD-10-CM

## 2023-02-11 DIAGNOSIS — M9904 Segmental and somatic dysfunction of sacral region: Secondary | ICD-10-CM | POA: Diagnosis not present

## 2023-02-11 DIAGNOSIS — S93601D Unspecified sprain of right foot, subsequent encounter: Secondary | ICD-10-CM

## 2023-02-11 DIAGNOSIS — M9901 Segmental and somatic dysfunction of cervical region: Secondary | ICD-10-CM | POA: Diagnosis not present

## 2023-02-11 DIAGNOSIS — G8929 Other chronic pain: Secondary | ICD-10-CM

## 2023-02-11 DIAGNOSIS — M9908 Segmental and somatic dysfunction of rib cage: Secondary | ICD-10-CM

## 2023-02-11 DIAGNOSIS — M9902 Segmental and somatic dysfunction of thoracic region: Secondary | ICD-10-CM | POA: Diagnosis not present

## 2023-02-11 DIAGNOSIS — M545 Low back pain, unspecified: Secondary | ICD-10-CM

## 2023-02-11 NOTE — Assessment & Plan Note (Addendum)
Back exam still has some loss lordosis but is responding relatively well to osteopathic manipulation.  Discussed which activities to do and which ones to avoid.  Increase activity s.  Lowly.  Follow-up again in 6 to 8 weeks otherwise.  No changes in medications.  Patient will continue to work on core strengthening.  Likely patient has been able to start increasing some of her activities and seems to be enjoying life

## 2023-02-11 NOTE — Assessment & Plan Note (Signed)
Continuing to work with physical therapy.  Discussed about potentially stretching and strengthening of the lateral column as well as stretching the posterior tibialis tendon.  I do think that this is more secondary to difficulties.  Patient will follow-up with me again in 6 to 8 weeks.

## 2023-02-11 NOTE — Patient Instructions (Signed)
Peroneal subluxation with tightness of posterior tibialis You are doing great overall See me again in 6 weeks

## 2023-03-10 ENCOUNTER — Ambulatory Visit: Payer: Medicare Other | Admitting: Family Medicine

## 2023-03-10 NOTE — Progress Notes (Unsigned)
[name] [address] [place] [address] Phone: [phone] Subjective:   Bianca [name], am serving as a scribe for Dr. Antoine Primas.  I'm seeing this patient by the request  of:  Arliss Journey, PA-C  CC: upper back pain follow up   [id number]  Bianca [name] is a 57 y.o. female coming in with complaint of back and neck pain. OMT on 02/11/2023. Patient states here for her routine OMT, feels like it is helpful when she does get it regularly.  Nothing that has been stopping her from activity and but luckily she has not been as active either.  Medications patient has been prescribed:   Taking:         Reviewed prior external information including notes and imaging from previsou exam, outside providers and external EMR if available.   As well as notes that were available from care everywhere and other healthcare systems.  Past medical history, social, surgical and family history all reviewed in electronic medical record.  No pertanent information unless stated regarding to the chief complaint.   Past Medical History:  Diagnosis Date   Allergy to alpha-gal    Thyroid disease     Allergies  Allergen Reactions   Atropine Sulfate Other (See Comments)    Low BP; numbness in both arms    Beef (Bovine) Protein Anaphylaxis    Alpha-gal syndrome    Beef-Derived Products Anaphylaxis    Alpha-gal syndrome    Gelatin Anaphylaxis    Alpha-gal syndrome    Heparin Anaphylaxis    Porcine heparin - Alpha-gal syndrome     Heparin (Bovine) Anaphylaxis    Alpha-gal syndrome   Milk (Cow) Anaphylaxis    Alpha-gal syndrome    Milk-Related Compounds Anaphylaxis    Alpha-gal syndrome    Pork-Derived Products Anaphylaxis    Alpha-gal syndrome    Sulfa Antibiotics Anaphylaxis   Prochlorperazine     Lowered BP; fainted    Shellfish Allergy Hives    Had reaction once to shrimp     Review of Systems:  No headache, visual  changes, nausea, vomiting, diarrhea, constipation, dizziness, abdominal pain, skin rash, fevers, chills, night sweats, weight loss, swollen lymph nodes, , joint swelling, chest pain, shortness of breath, mood changes. POSITIVE muscle aches, body aches  Objective  Blood pressure 110/70, pulse 72, height 5\' 7"  (1.702 m), weight 153 lb (69.4 kg), SpO2 97%.   General: No apparent distress alert and oriented x3 mood and affect normal, dressed appropriately.  HEENT: Pupils equal, extraocular movements intact  Respiratory: Patient's speak in full sentences and does not appear short of breath  Cardiovascular: No lower extremity edema, non tender, no erythema  Gait MSK:  Back does have some loss lordosis noted.  Some tenderness to palpation of the paraspinal musculature.  Patient does have discomfort that is diffuse in the axial skeleton.  Osteopathic findings  C3 flexed rotated and side bent right C5 flexed rotated and side bent left T3 extended rotated and side bent right inhaled rib T8 extended rotated and side bent left L2 flexed rotated and side bent right Sacrum right on right       Assessment and Plan:  Back pain Back exam does have loss of lordosis  Noted still.  Has madePatient does have tightness progress.  Will continue to increase activity.  No changes to medications secondary to patient's multiple allergies at the moment.  Follow-up again in 6 to 8 weeks    Nonallopathic  problems  Decision today to treat with OMT was based on Physical Exam  After verbal consent patient was treated with HVLA, ME, FPR techniques in cervical, rib, thoracic, lumbar, and sacral  areas  Patient tolerated the procedure well with improvement in symptoms  Patient given exercises, stretches and lifestyle modifications  See medications in patient instructions if given  Patient will follow up in 4-8 weeks     The above documentation has been reviewed and is accurate and complete Judi Saa, DO         Note: This dictation was prepared with Dragon dictation along with smaller phrase technology. Any transcriptional errors that result from this process are unintentional.

## 2023-03-16 ENCOUNTER — Encounter: Payer: Self-pay | Admitting: Family Medicine

## 2023-03-16 ENCOUNTER — Ambulatory Visit (INDEPENDENT_AMBULATORY_CARE_PROVIDER_SITE_OTHER): Payer: Medicare Other | Admitting: Family Medicine

## 2023-03-16 VITALS — BP 110/70 | HR 72 | Ht 67.0 in | Wt 153.0 lb

## 2023-03-16 DIAGNOSIS — M9904 Segmental and somatic dysfunction of sacral region: Secondary | ICD-10-CM

## 2023-03-16 DIAGNOSIS — M9901 Segmental and somatic dysfunction of cervical region: Secondary | ICD-10-CM | POA: Diagnosis not present

## 2023-03-16 DIAGNOSIS — G8929 Other chronic pain: Secondary | ICD-10-CM

## 2023-03-16 DIAGNOSIS — M545 Low back pain, unspecified: Secondary | ICD-10-CM | POA: Diagnosis not present

## 2023-03-16 DIAGNOSIS — M9903 Segmental and somatic dysfunction of lumbar region: Secondary | ICD-10-CM | POA: Diagnosis not present

## 2023-03-16 DIAGNOSIS — M9908 Segmental and somatic dysfunction of rib cage: Secondary | ICD-10-CM

## 2023-03-16 DIAGNOSIS — M9902 Segmental and somatic dysfunction of thoracic region: Secondary | ICD-10-CM

## 2023-03-16 NOTE — Patient Instructions (Addendum)
Good to see you  Over all you keep better and better I do like the idea of the proteins Follow up in 6-8 weeks

## 2023-03-16 NOTE — Assessment & Plan Note (Signed)
Back exam does have loss of lordosis  Noted still.  Has madePatient does have tightness progress.  Will continue to increase activity.  No changes to medications secondary to patient's multiple allergies at the moment.  Follow-up again in 6 to 8 weeks

## 2023-04-07 NOTE — Progress Notes (Unsigned)
[name] [address] [place] [address] Phone: [phone] Subjective:   Bianca [name], am serving as a scribe for Dr. Antoine [name].  I'm seeing this patient by the request  of:  Bianca Journey, PA-C  CC: back and neck pain   [id number]  Bianca [name] is a 57 y.o. female coming in with complaint of back and neck pain. OMT on 03/16/2023. Patient states same per usual. Feel for some knots on the medial side of B legs. Wondering about prescriptions.  Medications patient has been prescribed:   Taking:         Reviewed prior external information including notes and imaging from previsou exam, outside providers and external EMR if available.   As well as notes that were available from care everywhere and other healthcare systems.  Past medical history, social, surgical and family history all reviewed in electronic medical record.  No pertanent information unless stated regarding to the chief complaint.   Past Medical History:  Diagnosis Date   Allergy to alpha-gal    Thyroid disease     Allergies  Allergen Reactions   Atropine Sulfate Other (See Comments)    Low BP; numbness in both arms    Beef (Bovine) Protein Anaphylaxis    Alpha-gal syndrome    Beef-Derived Products Anaphylaxis    Alpha-gal syndrome    Gelatin Anaphylaxis    Alpha-gal syndrome    Heparin Anaphylaxis    Porcine heparin - Alpha-gal syndrome     Heparin (Bovine) Anaphylaxis    Alpha-gal syndrome   Milk (Cow) Anaphylaxis    Alpha-gal syndrome    Milk-Related Compounds Anaphylaxis    Alpha-gal syndrome    Pork-Derived Products Anaphylaxis    Alpha-gal syndrome    Sulfa Antibiotics Anaphylaxis   Prochlorperazine     Lowered BP; fainted    Shellfish Allergy Hives    Had reaction once to shrimp     Review of Systems:  No headache, visual changes, nausea, vomiting, diarrhea, constipation, dizziness, abdominal pain, skin rash, fevers,  chills, night sweats, weight loss, swollen lymph nodes,  joint swelling, chest pain, shortness of breath, mood changes. POSITIVE muscle aches, body aches  Objective  Blood pressure (!) 98/58, pulse 81, height 5\' 7"  (1.702 m), weight 154 lb (69.9 kg), SpO2 99%.   General: No apparent distress alert and oriented x3 mood and affect normal, dressed appropriately.  HEENT: Pupils equal, extraocular movements intact  Respiratory: Patient's speak in full sentences and does not appear short of breath  Cardiovascular: No lower extremity edema, non tender, no erythema  MSK:  Back does have some loss lordosis noted.  Patient does have tightness noted still in diaphragmatic area.  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  Given cromolyn to see if this would help with some of the mast cell destabilization.  Discussed with patient about icing regimen and home exercises otherwise.  Increase activity slowly.  Discussed icing regimen.  Follow-up with me again in 6 to 8 weeks otherwise.    Nonallopathic problems  Decision today to treat with OMT was based on Physical Exam  After verbal consent patient was treated  with HVLA, ME, FPR techniques in cervical, rib, thoracic, lumbar, and sacral  areas  Patient tolerated the procedure well with improvement in symptoms  Patient given exercises, stretches and lifestyle modifications  See medications in patient instructions if given  Patient will follow up in 4-8 weeks    The above documentation has been reviewed and is accurate and complete Bianca Saa, DO          Note: This dictation was prepared with Dragon dictation along with  smaller phrase technology. Any transcriptional errors that result from this process are unintentional.

## 2023-04-08 ENCOUNTER — Ambulatory Visit: Payer: Medicare Other | Admitting: Family Medicine

## 2023-04-08 ENCOUNTER — Encounter: Payer: Self-pay | Admitting: Family Medicine

## 2023-04-08 VITALS — BP 98/58 | HR 81 | Ht 67.0 in | Wt 154.0 lb

## 2023-04-08 DIAGNOSIS — Z91018 Allergy to other foods: Secondary | ICD-10-CM

## 2023-04-08 DIAGNOSIS — J986 Disorders of diaphragm: Secondary | ICD-10-CM | POA: Diagnosis not present

## 2023-04-08 DIAGNOSIS — M9908 Segmental and somatic dysfunction of rib cage: Secondary | ICD-10-CM

## 2023-04-08 DIAGNOSIS — M9904 Segmental and somatic dysfunction of sacral region: Secondary | ICD-10-CM

## 2023-04-08 DIAGNOSIS — M9903 Segmental and somatic dysfunction of lumbar region: Secondary | ICD-10-CM

## 2023-04-08 DIAGNOSIS — M9902 Segmental and somatic dysfunction of thoracic region: Secondary | ICD-10-CM | POA: Diagnosis not present

## 2023-04-08 DIAGNOSIS — M9901 Segmental and somatic dysfunction of cervical region: Secondary | ICD-10-CM | POA: Diagnosis not present

## 2023-04-08 MED ORDER — CROMOLYN SODIUM 5.2 MG/ACT NA AERS: 1.0000 | INHALATION_SPRAY | Freq: Four times a day (QID) | NASAL | 12 refills | Status: AC

## 2023-04-08 NOTE — Assessment & Plan Note (Signed)
Patient is doing significantly better at this time.  Given cromolyn to see if this would help with some of the mast cell destabilization.  Discussed with patient about icing regimen and home exercises otherwise.  Increase activity slowly.  Discussed icing regimen.  Follow-up with me again in 6 to 8 weeks otherwise.

## 2023-04-08 NOTE — Assessment & Plan Note (Signed)
Will attempt cromolyn nasal spray

## 2023-04-08 NOTE — Patient Instructions (Signed)
Do prescribed exercises at least 3x a week See you again in 7-8 weeks

## 2023-05-05 NOTE — Progress Notes (Unsigned)
[name] [address] [place] [address] Phone: [phone] Subjective:   Bruce Donath, am serving as a scribe for Dr. Antoine Primas.  I'm seeing this patient by the request  of:  Arliss Journey, PA-C  CC: Back pain, neck pain follow-up  ION:[id number]  Bianca [name] is a 57 y.o. female coming in with complaint of back and neck pain. OMT on 04/08/2023. Patient states that her back feels out of place. Had some numbness in hands over weekend and she popped spine and this went away. Notices rib movement and popping on L side.   Also would like some distraction of R foot as ankle feels out of place.   Medications patient has been prescribed:   Taking:         Reviewed prior external information including notes and imaging from previsou exam, outside providers and external EMR if available.   As well as notes that were available from care everywhere and other healthcare systems.  Past medical history, social, surgical and family history all reviewed in electronic medical record.  No pertanent information unless stated regarding to the chief complaint.   Past Medical History:  Diagnosis Date   Allergy to alpha-gal    Thyroid disease     Allergies  Allergen Reactions   Atropine Sulfate Other (See Comments)    Low BP; numbness in both arms    Beef (Bovine) Protein Anaphylaxis    Alpha-gal syndrome    Beef-Derived Drug Products Anaphylaxis    Alpha-gal syndrome    Gelatin Anaphylaxis    Alpha-gal syndrome    Heparin Anaphylaxis    Porcine heparin - Alpha-gal syndrome     Heparin (Bovine) Anaphylaxis    Alpha-gal syndrome   Milk (Cow) Anaphylaxis    Alpha-gal syndrome    Milk-Related Compounds Anaphylaxis    Alpha-gal syndrome    Pork-Derived Products Anaphylaxis    Alpha-gal syndrome    Sulfa Antibiotics Anaphylaxis   Prochlorperazine     Lowered BP; fainted    Shellfish Allergy Hives    Had reaction once to  shrimp     Review of Systems:  No headache, visual changes, nausea, vomiting, diarrhea, constipation, dizziness, abdominal pain, skin rash, fevers, chills, night sweats, weight loss, swollen lymph nodes, body aches, joint swelling, chest pain, shortness of breath, mood changes. POSITIVE muscle aches  Objective  Blood pressure 100/64, pulse 69, height 5\' 7"  (1.702 m), weight 154 lb (69.9 kg), SpO2 97%.   General: No apparent distress alert and oriented x3 mood and affect normal, dressed appropriately.  HEENT: Pupils equal, extraocular movements intact  Respiratory: Patient's speak in full sentences and does not appear short of breath  Cardiovascular: No lower extremity edema, non tender, no erythema  Gait MSK:  Back does have some very mild loss of lordosis.  Some tenderness to palpation.  Osteopathic findings  C2 flexed rotated and side bent right C6 flexed rotated and side bent left T3 extended rotated and side bent right inhaled rib T7 extended rotated and side bent left inhaled rib L2 flexed rotated and side bent right Sacrum right on right       Assessment and Plan:  Back pain Patient continues to make some improvement.  Did have discomfort again today which is the first time in a while and it has been that severe.  We discussed with patient strengthening.  Had not been doing some of the other exercises previously.  We discussed the yoga wheel  which I do think will be helpful.  Increase activity slowly otherwise.  Follow-up again in 6 to 8 weeks.  Right foot sprain Dropped cuboid noted today.  Will continue to monitor.  Discussed icing regimen exercise discussed with certain shoes.  Patient has had this previously and we will continue to monitor.  Follow-up with me again in 6 to 8 months.    Nonallopathic problems  Decision today to treat with OMT was based on Physical Exam  After verbal consent patient was treated with HVLA, ME, FPR techniques in cervical, rib,  thoracic, lumbar, and sacral  areas  Patient tolerated the procedure well with improvement in symptoms  Patient given exercises, stretches and lifestyle modifications  See medications in patient instructions if given  Patient will follow up in 4-8 weeks    The above documentation has been reviewed and is accurate and complete Judi Saa, DO          Note: This dictation was prepared with Dragon dictation along with smaller phrase technology. Any transcriptional errors that result from this process are unintentional.

## 2023-05-06 ENCOUNTER — Ambulatory Visit: Payer: Medicare Other | Admitting: Family Medicine

## 2023-05-06 ENCOUNTER — Encounter: Payer: Self-pay | Admitting: Family Medicine

## 2023-05-06 VITALS — BP 100/64 | HR 69 | Ht 67.0 in | Wt 154.0 lb

## 2023-05-06 DIAGNOSIS — M9902 Segmental and somatic dysfunction of thoracic region: Secondary | ICD-10-CM | POA: Diagnosis not present

## 2023-05-06 DIAGNOSIS — M9904 Segmental and somatic dysfunction of sacral region: Secondary | ICD-10-CM | POA: Diagnosis not present

## 2023-05-06 DIAGNOSIS — S93601D Unspecified sprain of right foot, subsequent encounter: Secondary | ICD-10-CM

## 2023-05-06 DIAGNOSIS — M9901 Segmental and somatic dysfunction of cervical region: Secondary | ICD-10-CM | POA: Diagnosis not present

## 2023-05-06 DIAGNOSIS — M9903 Segmental and somatic dysfunction of lumbar region: Secondary | ICD-10-CM

## 2023-05-06 DIAGNOSIS — G8929 Other chronic pain: Secondary | ICD-10-CM | POA: Diagnosis not present

## 2023-05-06 DIAGNOSIS — M545 Low back pain, unspecified: Secondary | ICD-10-CM | POA: Diagnosis not present

## 2023-05-06 DIAGNOSIS — M9908 Segmental and somatic dysfunction of rib cage: Secondary | ICD-10-CM | POA: Diagnosis not present

## 2023-05-06 NOTE — Patient Instructions (Addendum)
Good to see you Thank you for thinking of [place] during the holidays See me in 6-8 weeks

## 2023-05-06 NOTE — Assessment & Plan Note (Signed)
Patient continues to make some improvement.  Did have discomfort again today which is the first time in a while and it has been that severe.  We discussed with patient strengthening.  Had not been doing some of the other exercises previously.  We discussed the yoga wheel which I do think will be helpful.  Increase activity slowly otherwise.  Follow-up again in 6 to 8 weeks.

## 2023-05-06 NOTE — Assessment & Plan Note (Signed)
Dropped cuboid noted today.  Will continue to monitor.  Discussed icing regimen exercise discussed with certain shoes.  Patient has had this previously and we will continue to monitor.  Follow-up with me again in 6 to 8 months.

## 2023-06-02 NOTE — Progress Notes (Signed)
[name] [address] [place] [address] Phone: [phone] Subjective:   Bianca [name], am serving as a scribe for Dr. Antoine Primas.  I'm seeing this patient by the request  of:  Arliss Journey, PA-C  CC: back and neck pain follow up   [id number]  Bianca [name] is a 58 y.o. female coming in with complaint of back and neck pain. OMT 05/06/2023. Patient states that she was doing well but pain has increased due to illness over new year.  Patient feels like she is doing relatively well but did have some discomfort recently.  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  Rolled R ankle. It is out of place again.  Medications patient has been prescribed: [place], Qvar        Reviewed prior external information including notes and imaging from previsou exam, outside providers and external EMR if available.   As well as notes that were available from care everywhere and other healthcare systems.  Past medical history, social, surgical and family history all reviewed in electronic medical record.  No pertanent information unless stated regarding to the chief complaint.   Past Medical History:  Diagnosis Date   Allergy to alpha-gal    Thyroid disease     Allergies  Allergen Reactions   Atropine Sulfate Other (See Comments)    Low BP; numbness in both arms    Beef (Bovine) Protein Anaphylaxis    Alpha-gal syndrome    Beef-Derived Drug Products Anaphylaxis    Alpha-gal syndrome    Gelatin Anaphylaxis    Alpha-gal syndrome    Heparin Anaphylaxis    Porcine heparin - Alpha-gal syndrome     Heparin (Bovine) Anaphylaxis    Alpha-gal syndrome   Milk (Cow) Anaphylaxis    Alpha-gal syndrome    Milk-Related Compounds Anaphylaxis    Alpha-gal syndrome    Pork-Derived Products Anaphylaxis    Alpha-gal syndrome    Sulfa Antibiotics Anaphylaxis    Prochlorperazine     Lowered BP; fainted    Shellfish Allergy Hives    Had reaction once to shrimp     Review of Systems:  No headache, visual changes, nausea, vomiting, diarrhea, constipation, dizziness, abdominal pain, skin rash, fevers, chills, night sweats, weight loss, swollen lymph nodes, body aches, joint swelling, chest pain, shortness of breath, mood changes. POSITIVE muscle aches  Objective  Blood pressure 98/68, pulse 77, height 5\' 7"  (1.702 m), weight 156 lb (70.8 kg), SpO2 98%.   General: No apparent distress alert and oriented x3 mood and affect normal, dressed appropriately.  HEENT: Pupils equal, extraocular movements intact  Respiratory: Patient's speak in full sentences and does not appear short of breath  Cardiovascular: No lower extremity edema, non tender, no erythema  Gait normal  MSK:  Back loss of lordosis tightness with FABER  Thoracic tenderness noted.  Patient still has some tenderness to palpation in the diaphragmatic area.  Seems to be on the back though more on the right parascapular area.  Right ankle exam does show the patient does have tenderness to palpation over the ATFL.  Mild positive anterior drawer test.  Patient has no weakness no noted.  Neurovascular intact distally.  Osteopathic findings  C7 flexed rotated and side bent left T3 extended rotated and side bent right inhaled rib T7 extended rotated and side bent left inhaled rib L2 flexed rotated and  side bent right Sacrum right on right     Assessment and Plan:  Back pain Back exam does have some loss lordosis noted.  Some tenderness to palpation in the paraspinal musculature.  Tightness with FABER test still noted that we will monitor.  Patient has still made significant strides in improvement.  Follow-up with me again in 6 to 8 weeks.  Right foot sprain Seems to be more of the ankle.  Continues to have chronic irritation and pain.  Discussed the possibility of repeating injections if  needed.  Discussed which activities to do and which ones to avoid.  Follow-up with me again in 6 to 8 weeks otherwise.  Worsening pain will need to consider the possibility of an MRI of    Nonallopathic problems  Decision today to treat with OMT was based on Physical Exam  After verbal consent patient was treated with HVLA, ME, FPR techniques in cervical, rib, thoracic, lumbar, and sacral  areas  Patient tolerated the procedure well with improvement in symptoms  Patient given exercises, stretches and lifestyle modifications  See medications in patient instructions if given  Patient will follow up in 4-8 weeks     The above documentation has been reviewed and is accurate and complete Judi Saa, DO         Note: This dictation was prepared with Dragon dictation along with smaller phrase technology. Any transcriptional errors that result from this process are unintentional.

## 2023-06-03 ENCOUNTER — Ambulatory Visit: Payer: Medicare Other | Admitting: Family Medicine

## 2023-06-03 VITALS — BP 98/68 | HR 77 | Ht 67.0 in | Wt 156.0 lb

## 2023-06-03 DIAGNOSIS — S93601D Unspecified sprain of right foot, subsequent encounter: Secondary | ICD-10-CM

## 2023-06-03 DIAGNOSIS — M9902 Segmental and somatic dysfunction of thoracic region: Secondary | ICD-10-CM | POA: Diagnosis not present

## 2023-06-03 DIAGNOSIS — M9908 Segmental and somatic dysfunction of rib cage: Secondary | ICD-10-CM | POA: Diagnosis not present

## 2023-06-03 DIAGNOSIS — M545 Low back pain, unspecified: Secondary | ICD-10-CM | POA: Diagnosis not present

## 2023-06-03 DIAGNOSIS — M9903 Segmental and somatic dysfunction of lumbar region: Secondary | ICD-10-CM | POA: Diagnosis not present

## 2023-06-03 DIAGNOSIS — M9901 Segmental and somatic dysfunction of cervical region: Secondary | ICD-10-CM

## 2023-06-03 DIAGNOSIS — M25571 Pain in right ankle and joints of right foot: Secondary | ICD-10-CM | POA: Diagnosis not present

## 2023-06-03 DIAGNOSIS — M9904 Segmental and somatic dysfunction of sacral region: Secondary | ICD-10-CM

## 2023-06-03 DIAGNOSIS — G8929 Other chronic pain: Secondary | ICD-10-CM | POA: Diagnosis not present

## 2023-06-03 NOTE — Patient Instructions (Signed)
Glad you were able to sing Thanks for making my day See me in 6-8 weeks MRI R ankle

## 2023-06-03 NOTE — Assessment & Plan Note (Addendum)
Seems to be more of the ankle.  Continues to have chronic irritation and pain.  Discussed the possibility of repeating injections if needed.  Discussed which activities to do and which ones to avoid.  Follow-up with me again in 6 to 8 weeks otherwise.  Patient is concerned though because continuing to have recurrent injuries.  Does have an anterior drawer test noted.  Will get MRI to further evaluate for any other ligamentous injury that could potentially contribute to this.  Until we see patient again encourage patient to continue to wear brace

## 2023-06-03 NOTE — Assessment & Plan Note (Signed)
Back exam does have some loss lordosis noted.  Some tenderness to palpation in the paraspinal musculature.  Tightness with FABER test still noted that we will monitor.  Patient has still made significant strides in improvement.  Follow-up with me again in 6 to 8 weeks.

## 2023-06-04 ENCOUNTER — Encounter: Payer: Self-pay | Admitting: Family Medicine

## 2023-06-29 ENCOUNTER — Telehealth: Payer: Self-pay | Admitting: Family Medicine

## 2023-06-29 NOTE — Telephone Encounter (Signed)
I received a call from Flowella at [facility] in regards to the MRI that was ordered. She is currently scheduled for 07/05/2023 but the authorization will expire on 07/03/2023. I advised that we will have to wait until it expires and can re-auth on Monday.   Tammy Sours can be reached at [phone].

## 2023-06-30 NOTE — Progress Notes (Unsigned)
[name] [address] [place] [address] Phone: [phone] Subjective:   Bianca [name], am serving as a scribe for Dr. Antoine Primas.  I'm seeing this patient by the request  of:  Arliss Journey, PA-C  CC: Low back pain, upper back pain follow-up  BJY:[id number]  Bianca [name] is a 58 y.o. female coming in with complaint of back and neck pain. OMT on 06/03/2023.  Since we have seen patient patient has been seen by other providers.  Most recent CEA examination 0.7.  This is stable for the last 4 years.  Patient is due for a colonoscopy.  Patient states that she is doing ok. Back is the same as last visit.   Patient has had ankle pain as well and is awaiting her MRI of ankle.  Been going to physical therapy. Pain in ankle mortise.   Medications patient has been prescribed:   Taking:         Reviewed prior external information including notes and imaging from previsou exam, outside providers and external EMR if available.   As well as notes that were available from care everywhere and other healthcare systems.  Past medical history, social, surgical and family history all reviewed in electronic medical record.  No pertanent information unless stated regarding to the chief complaint.   Past Medical History:  Diagnosis Date   Allergy to alpha-gal    Thyroid disease     Allergies  Allergen Reactions   Atropine Sulfate Other (See Comments)    Low BP; numbness in both arms    Beef (Bovine) Protein Anaphylaxis    Alpha-gal syndrome    Beef-Derived Drug Products Anaphylaxis    Alpha-gal syndrome    Gelatin Anaphylaxis    Alpha-gal syndrome    Heparin Anaphylaxis    Porcine heparin - Alpha-gal syndrome     Heparin (Bovine) Anaphylaxis    Alpha-gal syndrome   Milk (Cow) Anaphylaxis    Alpha-gal syndrome    Milk-Related Compounds Anaphylaxis    Alpha-gal syndrome    Pork-Derived Products Anaphylaxis    Alpha-gal  syndrome    Sulfa Antibiotics Anaphylaxis   Prochlorperazine     Lowered BP; fainted    Shellfish Allergy Hives    Had reaction once to shrimp     Review of Systems:  No headache, visual changes, nausea, vomiting, diarrhea, constipation, dizziness, abdominal pain, skin rash, fevers, chills, night sweats, weight loss, swollen lymph nodes, body aches, joint swelling, chest pain, shortness of breath, mood changes. POSITIVE muscle aches  Objective  Blood pressure 102/64, pulse 65, height 5\' 7"  (1.702 m), weight 144 lb (65.3 kg), SpO2 97%.   General: No apparent distress alert and oriented x3 mood and affect normal, dressed appropriately.  HEENT: Pupils equal, extraocular movements intact  Respiratory: Patient's speak in full sentences and does not appear short of breath  Cardiovascular: No lower extremity edema, non tender, no erythema  MSK:  Back does have loss of lordosis tightness faber pain in the thoracic area, slipped rib  Antalgic gait secondary to patient's right ankle. 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  Discussed HEP  Keep working on posture  Patient will continue to monitor.  Follow-up again in 6 to 8 weeks  Right ankle pain Right ankle pain.  Discussed icing regimen and home exercises.  We discussed continuing the posture.  Follow-up again in 6 to 8 weeks.    Nonallopathic problems  Decision today to treat with OMT was based on Physical Exam  After verbal consent patient was treated with HVLA, ME, FPR techniques in cervical, rib, thoracic, lumbar, and sacral  areas  Patient  tolerated the procedure well with improvement in symptoms  Patient given exercises, stretches and lifestyle modifications  See medications in patient instructions if given  Patient will follow up in 4-8 weeks      The above documentation has been reviewed and is accurate and complete Judi Saa, DO        Note: This dictation was prepared with Dragon dictation along with smaller phrase technology. Any transcriptional errors that result from this process are unintentional.

## 2023-07-01 ENCOUNTER — Encounter: Payer: Self-pay | Admitting: Family Medicine

## 2023-07-01 ENCOUNTER — Ambulatory Visit: Payer: Medicare Other | Admitting: Family Medicine

## 2023-07-01 VITALS — BP 102/64 | HR 65 | Ht 67.0 in | Wt 144.0 lb

## 2023-07-01 DIAGNOSIS — M9908 Segmental and somatic dysfunction of rib cage: Secondary | ICD-10-CM | POA: Diagnosis not present

## 2023-07-01 DIAGNOSIS — M25571 Pain in right ankle and joints of right foot: Secondary | ICD-10-CM | POA: Diagnosis not present

## 2023-07-01 DIAGNOSIS — J986 Disorders of diaphragm: Secondary | ICD-10-CM

## 2023-07-01 DIAGNOSIS — M9903 Segmental and somatic dysfunction of lumbar region: Secondary | ICD-10-CM | POA: Diagnosis not present

## 2023-07-01 DIAGNOSIS — M9902 Segmental and somatic dysfunction of thoracic region: Secondary | ICD-10-CM

## 2023-07-01 DIAGNOSIS — M9904 Segmental and somatic dysfunction of sacral region: Secondary | ICD-10-CM | POA: Diagnosis not present

## 2023-07-01 DIAGNOSIS — M9901 Segmental and somatic dysfunction of cervical region: Secondary | ICD-10-CM

## 2023-07-01 NOTE — Assessment & Plan Note (Signed)
Right ankle pain.  Discussed icing regimen and home exercises.  We discussed continuing the posture.  Follow-up again in 6 to 8 weeks.

## 2023-07-01 NOTE — Patient Instructions (Signed)
Good to see you See me again in 6-8 weeks

## 2023-07-01 NOTE — Assessment & Plan Note (Signed)
Diaphragmatic disorder does respond to OMT.  Discussed HEP  Keep working on posture  Patient will continue to monitor.  Follow-up again in 6 to 8 weeks

## 2023-07-05 NOTE — Telephone Encounter (Signed)
 Pre-cert reappoved.  Auth# [id number] exp. 08/03/23.

## 2023-07-28 NOTE — Progress Notes (Deleted)
  [name] [address] [place] [address] Phone: [phone] Subjective:    I'm seeing this patient by the request  of:  Arliss Journey, PA-C  CC:   BJY:[id number]  Bianca [name] is a 58 y.o. female coming in with complaint of back and neck pain. OMT on 07/01/2023. Patient states   Medications patient has been prescribed:   Taking:         Reviewed prior external information including notes and imaging from previsou exam, outside providers and external EMR if available.   As well as notes that were available from care everywhere and other healthcare systems.  Past medical history, social, surgical and family history all reviewed in electronic medical record.  No pertanent information unless stated regarding to the chief complaint.   Past Medical History:  Diagnosis Date   Allergy to alpha-gal    Thyroid disease     Allergies  Allergen Reactions   Atropine Sulfate Other (See Comments)    Low BP; numbness in both arms    Beef (Bovine) Protein Anaphylaxis    Alpha-gal syndrome    Beef-Derived Drug Products Anaphylaxis    Alpha-gal syndrome    Gelatin Anaphylaxis    Alpha-gal syndrome    Heparin Anaphylaxis    Porcine heparin - Alpha-gal syndrome     Heparin (Bovine) Anaphylaxis    Alpha-gal syndrome   Milk (Cow) Anaphylaxis    Alpha-gal syndrome    Milk-Related Compounds Anaphylaxis    Alpha-gal syndrome    Pork-Derived Products Anaphylaxis    Alpha-gal syndrome    Sulfa Antibiotics Anaphylaxis   Prochlorperazine     Lowered BP; fainted    Shellfish Allergy Hives    Had reaction once to shrimp     Review of Systems:  No headache, visual changes, nausea, vomiting, diarrhea, constipation, dizziness, abdominal pain, skin rash, fevers, chills, night sweats, weight loss, swollen lymph nodes, body aches, joint swelling, chest pain, shortness of breath, mood changes. POSITIVE muscle aches  Objective  There  were no vitals taken for this visit.   General: No apparent distress alert and oriented x3 mood and affect normal, dressed appropriately.  HEENT: Pupils equal, extraocular movements intact  Respiratory: Patient's speak in full sentences and does not appear short of breath  Cardiovascular: No lower extremity edema, non tender, no erythema  Gait MSK:  Back   Osteopathic findings  C2 flexed rotated and side bent right C6 flexed rotated and side bent left T3 extended rotated and side bent right inhaled rib T9 extended rotated and side bent left L2 flexed rotated and side bent right Sacrum right on right       Assessment and Plan:  No problem-specific Assessment & Plan notes found for this encounter.    Nonallopathic problems  Decision today to treat with OMT was based on Physical Exam  After verbal consent patient was treated with HVLA, ME, FPR techniques in cervical, rib, thoracic, lumbar, and sacral  areas  Patient tolerated the procedure well with improvement in symptoms  Patient given exercises, stretches and lifestyle modifications  See medications in patient instructions if given  Patient will follow up in 4-8 weeks             Note: This dictation was prepared with Dragon dictation along with smaller phrase technology. Any transcriptional errors that result from this process are unintentional.

## 2023-07-29 ENCOUNTER — Ambulatory Visit: Payer: Medicare Other | Admitting: Family Medicine

## 2023-08-19 ENCOUNTER — Ambulatory Visit: Admitting: Sports Medicine

## 2023-08-19 VITALS — BP 100/60 | HR 86 | Ht 67.0 in | Wt 157.8 lb

## 2023-08-19 DIAGNOSIS — G8929 Other chronic pain: Secondary | ICD-10-CM | POA: Diagnosis not present

## 2023-08-19 DIAGNOSIS — M9904 Segmental and somatic dysfunction of sacral region: Secondary | ICD-10-CM

## 2023-08-19 DIAGNOSIS — M546 Pain in thoracic spine: Secondary | ICD-10-CM

## 2023-08-19 DIAGNOSIS — M9905 Segmental and somatic dysfunction of pelvic region: Secondary | ICD-10-CM

## 2023-08-19 DIAGNOSIS — M9908 Segmental and somatic dysfunction of rib cage: Secondary | ICD-10-CM | POA: Diagnosis not present

## 2023-08-19 DIAGNOSIS — M9902 Segmental and somatic dysfunction of thoracic region: Secondary | ICD-10-CM | POA: Diagnosis not present

## 2023-08-19 DIAGNOSIS — M25571 Pain in right ankle and joints of right foot: Secondary | ICD-10-CM | POA: Diagnosis not present

## 2023-08-19 DIAGNOSIS — M9903 Segmental and somatic dysfunction of lumbar region: Secondary | ICD-10-CM

## 2023-08-19 NOTE — Progress Notes (Signed)
 Bianca [name] [facility] [address] [place] [address] Phone: [phone]   Assessment and Plan:     1. Chronic pain of right ankle (Primary) -Chronic with exacerbation, subsequent visit - Continued right foot and ankle pain.  Patient continues to complain of subluxing cuboid bone.  No evidence of subluxation or dislocation on physical exam.  Patient was tender along peroneal tendons, Achilles tendon, plantar fascia, but no pinpoint bony tenderness or gross deformity - Recommend continuing HEP and physical therapy - May use topical Voltaren gel over areas of pain - Patient has had some improvement with wearing athletic footwear and plan fasciitis inserts.  Patient may benefit from custom orthotics.  Referral placed - May use ankle lace up brace as needed for support, though recommend limiting its use to prevent long-term stiffness - Prior MRI from February 2025 performed at [facility] showed ATFL attenuation and tear, mild edema to CFL and Achilles tendon, but was otherwise unremarkable -Patient states that she had liver inflammation from chronic naproxen use and would prefer to not use prolonged p.o. NSAIDs  2. Chronic bilateral thoracic back pain 3. Somatic dysfunction of thoracic region 4. Somatic dysfunction of lumbar region 5. Somatic dysfunction of pelvic region 6. Somatic dysfunction of rib region 7. Somatic dysfunction of sacral region -Chronic with exacerbation, subsequent visit - Recurrence of multiple areas of musculoskeletal pain including upper back, lower back, hips, pelvis, sacrum.  Patient primarily relates this to altered gait with chronic right foot and ankle pain and prior thoracic surgery - Patient has received relief with OMT in the past.  Elects for repeat OMT today.  Tolerated well per note below. - Decision today to treat with OMT was based on Physical Exam   After verbal consent patient was treated with HVLA (high velocity low  amplitude), ME (muscle energy), FPR (flex positional release), ST (soft tissue), PC/PD (Pelvic Compression/ Pelvic Decompression) techniques in sacrum, rib, thoracic, lumbar, and pelvic areas. Patient tolerated the procedure well with improvement in symptoms.  Patient educated on potential side effects of soreness and recommended to rest, hydrate, and use Tylenol as needed for pain control.   Pertinent previous records reviewed include none  Follow Up: Patient already has follow-up visits scheduled for reevaluation of right foot and ankle and reevaluation for back pain with possible repeat OMT if appropriate   Subjective:   I, Rolland Bimler am a scribe for Dr. Jean Rosenthal.    Chief Complaint: OMT  HPI:   06/03/2023 Bianca [name] is a 58 y.o. female coming in with complaint of back and neck pain. OMT 05/06/2023. Patient states that she was doing well but pain has increased due to illness over new year.  Patient feels like she is doing relatively well but did have some discomfort recently.  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   Rolled R ankle. It is out of place again.   Medications patient has been prescribed: Cromlyn, Qvar  08/19/2023 Patient states right ankle is hurting today. Got it taped really well since the last time. There has been improvement but it comes and goes. It pops out of place at times. Maybe orthotics shoes insoles, etc might help per patient.    Relevant Historical Information: History of cancer, in remission for 3+ years  Additional pertinent review of systems negative.  Current Outpatient Medications  Medication Sig Dispense Refill  beclomethasone (QVAR REDIHALER) 40 MCG/ACT inhaler Inhale 1 puff into the lungs 2 (two) times daily. 1 each 0   bismuth subsalicylate (PEPTO BISMOL) 262 MG/15ML suspension Take 30 mLs by mouth daily as needed for indigestion or diarrhea or loose  stools.     cromolyn (NASALCROM) 5.2 MG/ACT nasal spray Place 1 spray into both nostrils 4 (four) times daily. 26 mL 12   EPINEPHrine 0.3 mg/0.3 mL IJ SOAJ injection Inject 0.3 mg into the muscle once as needed for anaphylaxis.     levothyroxine (SYNTHROID) 137 MCG tablet Take 68.5 mcg by mouth daily.     No current facility-administered medications for this visit.      Objective:     Vitals:   08/19/23 1322  BP: 100/60  Pulse: 86  SpO2: 98%  Weight: 157 lb 12.8 oz (71.6 kg)  Height: 5\' 7"  (1.702 m)      Body mass index is 24.71 kg/m.    Physical Exam:     General: Well-appearing, cooperative, sitting comfortably in no acute distress.   OMT Physical Exam:  ASIS Compression Test: Positive Right Sacrum: Positive sphinx, TTP bilateral sacral base Rib: Ribs 6-8 stuck in inhalation on right Thoracic: TTP paraspinal, T6/8 RRSL Lumbar: TTP paraspinal, L1-3 RRSL Pelvis: Right anterior innominate  Gen: Appears well, nad, nontoxic and pleasant Psych: Alert and oriented, appropriate mood and affect Neuro: sensation intact, strength is 5/5 with df/pf/inv/ev, muscle tone wnl Skin: no susupicious lesions or rashes  Right foot/ankle:  No deformity, no swelling or effusion TTP posterior to lateral malleolus along peroneal tendons, Achilles tendon, along plantar fascial lateral band NTTP over fibular head, lat mal, medial mal,  , navicular, base of 5th, ATFL, CFL, deltoid, calcaneous or midfoot ROM DF 30, PF 45, inv/ev intact Negative ant drawer, talar tilt, rotation test, squeeze test. Neg thompson No pain with resisted inversion or eversion   Electronically signed by:  Bianca [name] [facility] 2:05 PM 08/19/23

## 2023-08-19 NOTE — Patient Instructions (Signed)
 Referred for Custom Orthotics. Follow up as scheduled.

## 2023-08-25 ENCOUNTER — Encounter: Admitting: Sports Medicine

## 2023-08-31 ENCOUNTER — Ambulatory Visit (INDEPENDENT_AMBULATORY_CARE_PROVIDER_SITE_OTHER): Admitting: Family Medicine

## 2023-08-31 VITALS — BP 106/64 | Ht 67.0 in | Wt 155.0 lb

## 2023-08-31 DIAGNOSIS — M79671 Pain in right foot: Secondary | ICD-10-CM

## 2023-09-01 NOTE — Progress Notes (Signed)
 PCP: Vladimir Groves, PA-C  Chief Complaint: right foot pain Subjective:   HPI: Patient is a 58 y.o. female here for Right foot pain.  Patient states that she has a history of cuboid subluxation and has been seeing Dr. Felipe Horton at [facility] clinic and was advised to follow-up in sports medicine clinic to get orthotics.  ***  Past Medical History:  Diagnosis Date   Allergy to alpha-gal    Thyroid disease     Current Outpatient Medications on File Prior to Visit  Medication Sig Dispense Refill   beclomethasone (QVAR REDIHALER) 40 MCG/ACT inhaler Inhale 1 puff into the lungs 2 (two) times daily. 1 each 0   bismuth subsalicylate (PEPTO BISMOL) 262 MG/15ML suspension Take 30 mLs by mouth daily as needed for indigestion or diarrhea or loose stools.     cromolyn (NASALCROM) 5.2 MG/ACT nasal spray Place 1 spray into both nostrils 4 (four) times daily. 26 mL 12   EPINEPHrine 0.3 mg/0.3 mL IJ SOAJ injection Inject 0.3 mg into the muscle once as needed for anaphylaxis.     levothyroxine (SYNTHROID) 137 MCG tablet Take 68.5 mcg by mouth daily.     No current facility-administered medications on file prior to visit.    Past Surgical History:  Procedure Laterality Date   ADENOIDECTOMY     TONSILLECTOMY      Allergies  Allergen Reactions   Atropine Sulfate Other (See Comments)    Low BP; numbness in both arms    Beef (Bovine) Protein Anaphylaxis    Alpha-gal syndrome    Beef-Derived Drug Products Anaphylaxis    Alpha-gal syndrome    Gelatin Anaphylaxis    Alpha-gal syndrome    Heparin Anaphylaxis    Porcine heparin - Alpha-gal syndrome     Heparin (Bovine) Anaphylaxis    Alpha-gal syndrome   Milk (Cow) Anaphylaxis    Alpha-gal syndrome    Milk-Related Compounds Anaphylaxis    Alpha-gal syndrome    Pork-Derived Products Anaphylaxis    Alpha-gal syndrome    Sulfa Antibiotics Anaphylaxis   Prochlorperazine     Lowered BP; fainted    Shellfish Allergy Hives    Had reaction  once to shrimp    BP 106/64   Ht 5\' 7"  (1.702 m)   Wt 155 lb (70.3 kg)   BMI 24.28 kg/m       No data to display              No data to display              Objective:  Physical Exam:  Gen: NAD, comfortable in exam room  ***   Assessment & Plan:  1. ***   Jude Norton MD, PGY-4  Sports Medicine [facility]

## 2023-09-08 NOTE — Progress Notes (Unsigned)
 [facility] [address] [place] [address] Phone: [phone] Subjective:   Delwyn Filippo, am serving as a scribe for Dr. Ronnell Coins.  I'm seeing this patient by the request  of:  Vladimir Groves, PA-C  CC:   BJY:[id number]  Shantanique Hodo is a 58 y.o. female coming in with complaint of back and neck pain. OMT on 08/19/2023. Patient states that she had 5 weeks of no spasms after last visit with overcorrection.   Sternum has not been as stable since last visit.   Has been trying to do a lot of overhead stretching for esophagus.  R foot has been able to mobilize heel.     Medications patient has been prescribed:   Taking:     Been in PT for the back      Reviewed prior external information including notes and imaging from previsou exam, outside providers and external EMR if available.   As well as notes that were available from care everywhere and other healthcare systems.  Past medical history, social, surgical and family history all reviewed in electronic medical record.  No pertanent information unless stated regarding to the chief complaint.   Past Medical History:  Diagnosis Date   Allergy to alpha-gal    Thyroid disease     Allergies  Allergen Reactions   Atropine Sulfate Other (See Comments)    Low BP; numbness in both arms    Beef (Bovine) Protein Anaphylaxis    Alpha-gal syndrome    Beef-Derived Drug Products Anaphylaxis    Alpha-gal syndrome    Gelatin Anaphylaxis    Alpha-gal syndrome    Heparin Anaphylaxis    Porcine heparin - Alpha-gal syndrome     Heparin (Bovine) Anaphylaxis    Alpha-gal syndrome   Milk (Cow) Anaphylaxis    Alpha-gal syndrome    Milk-Related Compounds Anaphylaxis    Alpha-gal syndrome    Pork-Derived Products Anaphylaxis    Alpha-gal syndrome    Sulfa Antibiotics Anaphylaxis   Prochlorperazine     Lowered BP; fainted    Shellfish Allergy Hives    Had reaction once to  shrimp     Review of Systems:  No headache, visual changes, nausea, vomiting, diarrhea, constipation, dizziness, abdominal pain, skin rash, fevers, chills, night sweats, weight loss, swollen lymph nodes, body aches, joint swelling, chest pain, shortness of breath, mood changes. POSITIVE muscle aches  Objective  There were no vitals taken for this visit.   General: No apparent distress alert and oriented x3 mood and affect normal, dressed appropriately.  HEENT: Pupils equal, extraocular movements intact  Respiratory: Patient's speak in full sentences and does not appear short of breath  Cardiovascular: No lower extremity edema, non tender, no erythema  Gait MSK:  Back   Osteopathic findings  C2 flexed rotated and side bent right C6 flexed rotated and side bent left T3 extended rotated and side bent right inhaled rib T9 extended rotated and side bent left L2 flexed rotated and side bent right Sacrum right on right       Assessment and Plan:  No problem-specific Assessment & Plan notes found for this encounter.    Nonallopathic problems  Decision today to treat with OMT was based on Physical Exam  After verbal consent patient was treated with HVLA, ME, FPR techniques in cervical, rib, thoracic, lumbar, and sacral  areas  Patient tolerated the procedure well with improvement in symptoms  Patient given exercises, stretches and lifestyle modifications  See  medications in patient instructions if given  Patient will follow up in 4-8 weeks    The above documentation has been reviewed and is accurate and complete Isidro Margo, DO          Note: This dictation was prepared with Dragon dictation along with smaller phrase technology. Any transcriptional errors that result from this process are unintentional.

## 2023-09-09 ENCOUNTER — Ambulatory Visit: Payer: Medicare Other | Admitting: Family Medicine

## 2023-09-09 VITALS — BP 108/60 | HR 64 | Ht 67.0 in | Wt 153.0 lb

## 2023-09-09 DIAGNOSIS — M9904 Segmental and somatic dysfunction of sacral region: Secondary | ICD-10-CM

## 2023-09-09 DIAGNOSIS — M9902 Segmental and somatic dysfunction of thoracic region: Secondary | ICD-10-CM | POA: Diagnosis not present

## 2023-09-09 DIAGNOSIS — M9901 Segmental and somatic dysfunction of cervical region: Secondary | ICD-10-CM

## 2023-09-09 DIAGNOSIS — M9903 Segmental and somatic dysfunction of lumbar region: Secondary | ICD-10-CM | POA: Diagnosis not present

## 2023-09-09 DIAGNOSIS — M9908 Segmental and somatic dysfunction of rib cage: Secondary | ICD-10-CM | POA: Diagnosis not present

## 2023-09-09 DIAGNOSIS — J986 Disorders of diaphragm: Secondary | ICD-10-CM | POA: Diagnosis not present

## 2023-09-09 NOTE — Patient Instructions (Addendum)
 Good to see you. You are doing great! Return in 2 to 3 months.

## 2023-09-10 ENCOUNTER — Encounter: Payer: Self-pay | Admitting: Family Medicine

## 2023-09-10 NOTE — Assessment & Plan Note (Signed)
 At this moment doing significantly better.  Very proud of the patient and the amount of improvement she has had.  Do think we can space out intervals at this time Patient to follow-up again in 1 week.  Discussed which activities to do and which ones to avoid otherwise.  Increase activity slowly.  Follow-up again in 10 weeks

## 2023-09-29 NOTE — Progress Notes (Deleted)
 Bianca [name] [address] [place] [address] Phone: [phone] Subjective:    [name] seeing this patient by the request  of:  Vladimir Groves, PA-C  CC: Ankle and back pain follow-up  BJY:[id number]  Bianca [name] is a 58 y.o. female coming in with complaint of back and neck pain. OMT on 09/09/2023. Patient states   Medications patient has been prescribed:   Taking:         Reviewed prior external information including notes and imaging from previsou exam, outside providers and external EMR if available.   As well as notes that were available from care everywhere and other healthcare systems.  Past medical history, social, surgical and family history all reviewed in electronic medical record.  No pertanent information unless stated regarding to the chief complaint.   Past Medical History:  Diagnosis Date   Allergy to alpha-gal    Thyroid disease     Allergies  Allergen Reactions   Atropine Sulfate Other (See Comments)    Low BP; numbness in both arms    Beef (Bovine) Protein Anaphylaxis    Alpha-gal syndrome    Beef-Derived Drug Products Anaphylaxis    Alpha-gal syndrome    Gelatin Anaphylaxis    Alpha-gal syndrome    Heparin Anaphylaxis    Porcine heparin - Alpha-gal syndrome     Heparin (Bovine) Anaphylaxis    Alpha-gal syndrome   Milk (Cow) Anaphylaxis    Alpha-gal syndrome    Milk-Related Compounds Anaphylaxis    Alpha-gal syndrome    Pork-Derived Products Anaphylaxis    Alpha-gal syndrome    Sulfa Antibiotics Anaphylaxis   Prochlorperazine     Lowered BP; fainted    Shellfish Allergy Hives    Had reaction once to shrimp     Review of Systems:  No headache, visual changes, nausea, vomiting, diarrhea, constipation, dizziness, abdominal pain, skin rash, fevers, chills, night sweats, weight loss, swollen lymph nodes, body aches, joint swelling, chest pain, shortness of breath, mood changes. POSITIVE muscle  aches  Objective  There were no vitals taken for this visit.   General: No apparent distress alert and oriented x3 mood and affect normal, dressed appropriately.  HEENT: Pupils equal, extraocular movements intact  Respiratory: Patient's speak in full sentences and does not appear short of breath  Cardiovascular: No lower extremity edema, non tender, no erythema  Gait MSK:  Back  Ankle exam shows  Osteopathic findings  C2 flexed rotated and side bent right C6 flexed rotated and side bent left T3 extended rotated and side bent right inhaled rib T9 extended rotated and side bent left L2 flexed rotated and side bent right Sacrum right on right   Limited muscular skeletal ultrasound was performed and interpreted by Bianca [name], Bianca [name]      Assessment and Plan:  No problem-specific Assessment & Plan notes found for this encounter.    Nonallopathic problems  Decision today to treat with OMT was based on Physical Exam  After verbal consent patient was treated with HVLA, ME, FPR techniques in cervical, rib, thoracic, lumbar, and sacral  areas  Patient tolerated the procedure well with improvement in symptoms  Patient given exercises, stretches and lifestyle modifications  See medications in patient instructions if given  Patient will follow up in 4-8 weeks    The above documentation has been reviewed and is accurate and complete Bianca Bianca [name] Smith, DO          Note: This dictation was prepared with [employer]  dictation along with smaller phrase technology. Any transcriptional errors that result from this process are unintentional.

## 2023-10-01 ENCOUNTER — Ambulatory Visit: Admitting: Family Medicine

## 2023-10-05 NOTE — Progress Notes (Signed)
 [facility] [address] [place] [address] Phone: [phone] Subjective:   Bianca [name], am serving as a scribe for Dr. Ronnell Coins.  I'm seeing this patient by the request  of:  Vladimir Groves, PA-C  CC:   Multiple complaints  BJY:NWGNFAOZHY  Bianca [name] is a 58 y.o. female coming in with complaint of back and neck pain. OMT on 09/09/2023. Patient states that her back and ankle are doing well. Foot is better but slightly out of place today. Sternum popped the other day. Has been able to lift heavy items with less pain.   Medications patient has been prescribed:   Taking:         Reviewed prior external information including notes and imaging from previsou exam, outside providers and external EMR if available.   As well as notes that were available from care everywhere and other healthcare systems.  Past medical history, social, surgical and family history all reviewed in electronic medical record.  No pertanent information unless stated regarding to the chief complaint.   Past Medical History:  Diagnosis Date   Allergy to alpha-gal    Thyroid disease     Allergies  Allergen Reactions   Atropine Sulfate Other (See Comments)    Low BP; numbness in both arms    Beef (Bovine) Protein Anaphylaxis    Alpha-gal syndrome    Beef-Derived Drug Products Anaphylaxis    Alpha-gal syndrome    Gelatin Anaphylaxis    Alpha-gal syndrome    Heparin Anaphylaxis    Porcine heparin - Alpha-gal syndrome     Heparin (Bovine) Anaphylaxis    Alpha-gal syndrome   Milk (Cow) Anaphylaxis    Alpha-gal syndrome    Milk-Related Compounds Anaphylaxis    Alpha-gal syndrome    Pork-Derived Products Anaphylaxis    Alpha-gal syndrome    Sulfa Antibiotics Anaphylaxis   Prochlorperazine     Lowered BP; fainted    Shellfish Allergy Hives    Had reaction once to shrimp     Review of Systems:  No, visual changes, nausea, vomiting,  diarrhea, constipation, dizziness, abdominal pain, skin rash, fevers, chills, night sweats, weight loss, swollen lymph nodes, body aches, joint swelling, chest pain, shortness of breath, mood changes. POSITIVE muscle aches, body aches, headache   Objective  Blood pressure 104/66, pulse 67, height 5\' 7"  (1.702 m), weight 154 lb (69.9 kg), SpO2 99%.   General: No apparent distress alert and oriented x3 mood and affect normal, dressed appropriately.  HEENT: Pupils equal, extraocular movements intact  Respiratory: Patient's speak in full sentences and does not appear short of breath  Cardiovascular: No lower extremity edema, non tender, no erythema  Gait MSK:  Back does have some loss lordosis noted.  Tightness in the thoracolumbar junction noted.  Osteopathic findings  T3 extended rotated and side bent right inhaled rib T6 extended rotated and side bent left L2 flexed rotated and side bent right L4 flexed rotated and side bent left Sacrum right on right       Assessment and Plan:  Diaphragmatic disorder Doing better at the moment.  Discussed icing regimen and home exercises.  Still responding well to osteopathic manipulation and has made significant strides.  Do think follow-up can happen in 2 months  Back pain   Tightness noted.  Will continue to monitor.  Has responded well to osteopathic manipulation after further evaluation.  Discussed which activities to do and which ones to avoid.  Follow-up again in  6 to 8 weeks  Acquired hypothyroidism   Following up with primary  Headache   Do not know how to categorize patient's headache.  Seems to be associated with almost any postictal phase.  Patient gives a history of this on a couple occasions.  Will send to headache clinic for further evaluation.  Patient has had an MRI previously of the head that I did review and not showing any white matter changes.    Nonallopathic problems  Decision today to treat with OMT was based on  Physical Exam  After verbal consent patient was treated with HVLA, ME, FPR techniques in  rib, thoracic, lumbar, and sacral  areas  Patient tolerated the procedure well with improvement in symptoms  Patient given exercises, stretches and lifestyle modifications  See medications in patient instructions if given  Patient will follow up in 4-8 weeks      The above documentation has been reviewed and is accurate and complete Ravonda Brecheen M Costantino Kohlbeck, DO        Note: This dictation was prepared with Dragon dictation along with smaller phrase technology. Any transcriptional errors that result from this process are unintentional.

## 2023-10-07 ENCOUNTER — Encounter: Payer: Self-pay | Admitting: Family Medicine

## 2023-10-07 ENCOUNTER — Ambulatory Visit (INDEPENDENT_AMBULATORY_CARE_PROVIDER_SITE_OTHER): Payer: Medicare Other | Admitting: Family Medicine

## 2023-10-07 VITALS — BP 104/66 | HR 67 | Ht 67.0 in | Wt 154.0 lb

## 2023-10-07 DIAGNOSIS — R519 Headache, unspecified: Secondary | ICD-10-CM | POA: Insufficient documentation

## 2023-10-07 DIAGNOSIS — J986 Disorders of diaphragm: Secondary | ICD-10-CM | POA: Diagnosis not present

## 2023-10-07 DIAGNOSIS — E039 Hypothyroidism, unspecified: Secondary | ICD-10-CM

## 2023-10-07 DIAGNOSIS — M9903 Segmental and somatic dysfunction of lumbar region: Secondary | ICD-10-CM | POA: Diagnosis not present

## 2023-10-07 DIAGNOSIS — M9904 Segmental and somatic dysfunction of sacral region: Secondary | ICD-10-CM

## 2023-10-07 DIAGNOSIS — G43809 Other migraine, not intractable, without status migrainosus: Secondary | ICD-10-CM | POA: Diagnosis not present

## 2023-10-07 DIAGNOSIS — M9908 Segmental and somatic dysfunction of rib cage: Secondary | ICD-10-CM

## 2023-10-07 DIAGNOSIS — G8929 Other chronic pain: Secondary | ICD-10-CM

## 2023-10-07 DIAGNOSIS — M9902 Segmental and somatic dysfunction of thoracic region: Secondary | ICD-10-CM | POA: Diagnosis not present

## 2023-10-07 DIAGNOSIS — M545 Low back pain, unspecified: Secondary | ICD-10-CM | POA: Diagnosis not present

## 2023-10-07 DIAGNOSIS — G4489 Other headache syndrome: Secondary | ICD-10-CM

## 2023-10-07 NOTE — Assessment & Plan Note (Signed)
 Doing better at the moment.  Discussed icing regimen and home exercises.  Still responding well to osteopathic manipulation and has made significant strides.  Do think follow-up can happen in 2 months

## 2023-10-07 NOTE — Assessment & Plan Note (Addendum)
 Do not know how to categorize patient's headache.  Seems to be associated with almost any postictal phase.  Patient gives a history of this on a couple occasions.  Will send to headache clinic for further evaluation.  Patient has had an MRI previously of the head that I did review and not showing any white matter changes.  Patient notes if severe worsening to call 911 or seek medical attention immediately.

## 2023-10-07 NOTE — Assessment & Plan Note (Signed)
 Following up with primary

## 2023-10-07 NOTE — Assessment & Plan Note (Signed)
 Tightness noted.  Will continue to monitor.  Has responded well to osteopathic manipulation after further evaluation.  Discussed which activities to do and which ones to avoid.  Follow-up again in 6 to 8 weeks

## 2023-10-07 NOTE — Patient Instructions (Signed)
 We will get you in with a headache clinic Keep us  updated  If things get worse seek medical attention See me in 1-2 months

## 2023-11-05 NOTE — Progress Notes (Deleted)
  [facility] [address] [place] [address] Phone: [phone] Subjective:    I'm seeing this patient by the request  of:  Vladimir Groves, PA-C  CC:   [id number]  Bianca [name] is a 58 y.o. female coming in with complaint of back and neck pain. OMT on 10/07/2023. Patient states   Medications patient has been prescribed:   Taking:         Reviewed prior external information including notes and imaging from previsou exam, outside providers and external EMR if available.   As well as notes that were available from care everywhere and other healthcare systems.  Past medical history, social, surgical and family history all reviewed in electronic medical record.  No pertanent information unless stated regarding to the chief complaint.   Past Medical History:  Diagnosis Date   Allergy to alpha-gal    Thyroid disease     Allergies  Allergen Reactions   Atropine Sulfate Other (See Comments)    Low BP; numbness in both arms    Beef (Bovine) Protein Anaphylaxis    Alpha-gal syndrome    Beef-Derived Drug Products Anaphylaxis    Alpha-gal syndrome    Gelatin Anaphylaxis    Alpha-gal syndrome    Heparin Anaphylaxis    Porcine heparin - Alpha-gal syndrome     Heparin (Bovine) Anaphylaxis    Alpha-gal syndrome   Milk (Cow) Anaphylaxis    Alpha-gal syndrome    Milk-Related Compounds Anaphylaxis    Alpha-gal syndrome    Pork-Derived Products Anaphylaxis    Alpha-gal syndrome    Sulfa Antibiotics Anaphylaxis   Prochlorperazine     Lowered BP; fainted    Shellfish Allergy Hives    Had reaction once to shrimp     Review of Systems:  No headache, visual changes, nausea, vomiting, diarrhea, constipation, dizziness, abdominal pain, skin rash, fevers, chills, night sweats, weight loss, swollen lymph nodes, body aches, joint swelling, chest pain, shortness of breath, mood changes. POSITIVE muscle aches  Objective  There  were no vitals taken for this visit.   General: No apparent distress alert and oriented x3 mood and affect normal, dressed appropriately.  HEENT: Pupils equal, extraocular movements intact  Respiratory: Patient's speak in full sentences and does not appear short of breath  Cardiovascular: No lower extremity edema, non tender, no erythema  Gait MSK:  Back   Osteopathic findings  C2 flexed rotated and side bent right C6 flexed rotated and side bent left T3 extended rotated and side bent right inhaled rib T9 extended rotated and side bent left L2 flexed rotated and side bent right Sacrum right on right       Assessment and Plan:  No problem-specific Assessment & Plan notes found for this encounter.    Nonallopathic problems  Decision today to treat with OMT was based on Physical Exam  After verbal consent patient was treated with HVLA, ME, FPR techniques in cervical, rib, thoracic, lumbar, and sacral  areas  Patient tolerated the procedure well with improvement in symptoms  Patient given exercises, stretches and lifestyle modifications  See medications in patient instructions if given  Patient will follow up in 4-8 weeks             Note: This dictation was prepared with Dragon dictation along with smaller phrase technology. Any transcriptional errors that result from this process are unintentional.

## 2023-11-12 ENCOUNTER — Ambulatory Visit: Admitting: Family Medicine

## 2023-12-06 NOTE — Progress Notes (Signed)
 Ben Peterson Mathey [facility] [address] [place] [address] Phone: [phone]   Assessment and Plan:     1. Chronic bilateral low back pain without sciatica 2. Chronic bilateral thoracic back pain 3. Right foot pain 4. Somatic dysfunction of cervical region 5. Somatic dysfunction of thoracic region 6. Somatic dysfunction of lumbar region 7. Somatic dysfunction of pelvic region 8. Somatic dysfunction of rib region 9. Somatic dysfunction of lower extremities  -Chronic with exacerbation, subsequent visit - Recurrence of multiple areas of musculoskeletal pain including right foot, lower back, bilateral hips, upper back - Patient has benefited from HEP.  Recommend continuing HEP - Patient states she had liver inflammation from chronic naproxen use and would prefer to not use prolonged prescription NSAIDs. - Use Tylenol  500 to 1000 mg tablets 2-3 times a day for day-to-day pain relief  - Patient has received relief with OMT in the past.  Elects for repeat OMT today.  Tolerated well per note below. - Decision today to treat with OMT was based on Physical Exam   After verbal consent patient was treated with HVLA (high velocity low amplitude), ME (muscle energy), FPR (flex positional release), ST (soft tissue), PC/PD (Pelvic Compression/ Pelvic Decompression) techniques in cervical, rib, thoracic, lower extremity, lumbar, and pelvic areas. Patient tolerated the procedure well with improvement in symptoms.  Patient educated on potential side effects of soreness and recommended to rest, hydrate, and use Tylenol  as needed for pain control.   Pertinent previous records reviewed include none  Follow Up: 4 to 6 weeks for reevaluation.  Could consider repeat OMT   Subjective:   I, [name], am serving as a Neurosurgeon for Doctor Morene Mace  Chief Complaint: OMT  HPI:  10/07/2023 Karis Rilling is a 58 y.o. female coming in with complaint of back and neck  pain. OMT on 09/09/2023. Patient states that her back and ankle are doing well. Foot is better but slightly out of place today. Sternum popped the other day. Has been able to lift heavy items with less pain.   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  Current Outpatient Medications  Medication Sig Dispense Refill   beclomethasone (QVAR  REDIHALER) 40 MCG/ACT inhaler Inhale 1 puff into the lungs 2 (two) times daily. 1 each 0   bismuth subsalicylate (PEPTO BISMOL) 262 MG/15ML suspension Take 30 mLs by mouth daily as needed for indigestion or diarrhea or loose stools.     cromolyn  (NASALCROM ) 5.2 MG/ACT nasal spray Place 1 spray into both nostrils 4 (four) times daily. 26 mL 12   EPINEPHrine 0.3 mg/0.3 mL IJ SOAJ injection Inject 0.3 mg into the muscle once as needed for anaphylaxis.     levothyroxine (SYNTHROID) 137 MCG tablet Take 68.5 mcg by mouth daily.     No current facility-administered medications for this visit.      Objective:     Vitals:   12/07/23 1500  Pulse: 71  SpO2: 98%  Weight: 156 lb (70.8 kg)  Height: 5' 7 (1.702 m)      Body mass index is 24.43 kg/m.    Physical Exam:     General: Well-appearing, cooperative, sitting comfortably in no acute distress.   OMT Physical Exam:  ASIS Compression Test: Positive Right Cervical: TTP paraspinal, C3 RRSL Rib: Bilateral elevated first rib with TTP Thoracic: TTP paraspinal, T4-6 RRSL, T7-9 RLSR Lumbar:  TTP paraspinal, L1-3 RRSL Pelvis: Right anterior innominate Lower extremity: TTP right cuboid.  Articulation with patient in full dorsiflexion, pressure at cuboid from plantar surface, with patient performing active plantarflexion  Electronically signed by:  Odis Mace [facility] 3:36 PM 12/07/23

## 2023-12-07 ENCOUNTER — Ambulatory Visit (INDEPENDENT_AMBULATORY_CARE_PROVIDER_SITE_OTHER): Admitting: Sports Medicine

## 2023-12-07 VITALS — HR 71 | Ht 67.0 in | Wt 156.0 lb

## 2023-12-07 DIAGNOSIS — G8929 Other chronic pain: Secondary | ICD-10-CM | POA: Diagnosis not present

## 2023-12-07 DIAGNOSIS — M9901 Segmental and somatic dysfunction of cervical region: Secondary | ICD-10-CM

## 2023-12-07 DIAGNOSIS — M545 Low back pain, unspecified: Secondary | ICD-10-CM

## 2023-12-07 DIAGNOSIS — M9903 Segmental and somatic dysfunction of lumbar region: Secondary | ICD-10-CM | POA: Diagnosis not present

## 2023-12-07 DIAGNOSIS — M9908 Segmental and somatic dysfunction of rib cage: Secondary | ICD-10-CM

## 2023-12-07 DIAGNOSIS — M79671 Pain in right foot: Secondary | ICD-10-CM

## 2023-12-07 DIAGNOSIS — M9906 Segmental and somatic dysfunction of lower extremity: Secondary | ICD-10-CM

## 2023-12-07 DIAGNOSIS — M546 Pain in thoracic spine: Secondary | ICD-10-CM

## 2023-12-07 DIAGNOSIS — M9905 Segmental and somatic dysfunction of pelvic region: Secondary | ICD-10-CM | POA: Diagnosis not present

## 2023-12-07 DIAGNOSIS — M9902 Segmental and somatic dysfunction of thoracic region: Secondary | ICD-10-CM | POA: Diagnosis not present

## 2023-12-29 NOTE — Progress Notes (Deleted)
  [facility] [address] [place] [address] Phone: [phone] Subjective:    I'm seeing this patient by the request  of:  Job Browning, PA-C  CC:   [id number]  Ladaisha Portillo is a 58 y.o. female coming in with complaint of back and neck pain. OMT on 10/07/2023. Patient states   Medications patient has been prescribed:   Taking:         Reviewed prior external information including notes and imaging from previsou exam, outside providers and external EMR if available.   As well as notes that were available from care everywhere and other healthcare systems.  Past medical history, social, surgical and family history all reviewed in electronic medical record.  No pertanent information unless stated regarding to the chief complaint.   Past Medical History:  Diagnosis Date   Allergy to alpha-gal    Thyroid disease     Allergies  Allergen Reactions   Atropine Sulfate Other (See Comments)    Low BP; numbness in both arms    Beef (Bovine) Protein Anaphylaxis    Alpha-gal syndrome    Beef-Derived Drug Products Anaphylaxis    Alpha-gal syndrome    Gelatin Anaphylaxis    Alpha-gal syndrome    Heparin Anaphylaxis    Porcine heparin - Alpha-gal syndrome     Heparin (Bovine) Anaphylaxis    Alpha-gal syndrome   Milk (Cow) Anaphylaxis    Alpha-gal syndrome    Milk-Related Compounds Anaphylaxis    Alpha-gal syndrome    Pork-Derived Products Anaphylaxis    Alpha-gal syndrome    Sulfa Antibiotics Anaphylaxis   Prochlorperazine     Lowered BP; fainted    Shellfish Allergy Hives    Had reaction once to shrimp     Review of Systems:  No headache, visual changes, nausea, vomiting, diarrhea, constipation, dizziness, abdominal pain, skin rash, fevers, chills, night sweats, weight loss, swollen lymph nodes, body aches, joint swelling, chest pain, shortness of breath, mood changes. POSITIVE muscle aches  Objective  There  were no vitals taken for this visit.   General: No apparent distress alert and oriented x3 mood and affect normal, dressed appropriately.  HEENT: Pupils equal, extraocular movements intact  Respiratory: Patient's speak in full sentences and does not appear short of breath  Cardiovascular: No lower extremity edema, non tender, no erythema  Gait MSK:  Back   Osteopathic findings  C2 flexed rotated and side bent right C6 flexed rotated and side bent left T3 extended rotated and side bent right inhaled rib T9 extended rotated and side bent left L2 flexed rotated and side bent right Sacrum right on right       Assessment and Plan:  No problem-specific Assessment & Plan notes found for this encounter.    Nonallopathic problems  Decision today to treat with OMT was based on Physical Exam  After verbal consent patient was treated with HVLA, ME, FPR techniques in cervical, rib, thoracic, lumbar, and sacral  areas  Patient tolerated the procedure well with improvement in symptoms  Patient given exercises, stretches and lifestyle modifications  See medications in patient instructions if given  Patient will follow up in 4-8 weeks             Note: This dictation was prepared with Dragon dictation along with smaller phrase technology. Any transcriptional errors that result from this process are unintentional.

## 2024-01-07 ENCOUNTER — Ambulatory Visit: Admitting: Family Medicine

## 2024-02-08 ENCOUNTER — Ambulatory Visit (INDEPENDENT_AMBULATORY_CARE_PROVIDER_SITE_OTHER): Admitting: Family Medicine

## 2024-02-08 ENCOUNTER — Ambulatory Visit: Admitting: Family Medicine

## 2024-02-08 VITALS — BP 106/64 | HR 77 | Ht 67.0 in | Wt 157.0 lb

## 2024-02-08 DIAGNOSIS — C787 Secondary malignant neoplasm of liver and intrahepatic bile duct: Secondary | ICD-10-CM

## 2024-02-08 DIAGNOSIS — M25571 Pain in right ankle and joints of right foot: Secondary | ICD-10-CM | POA: Diagnosis not present

## 2024-02-08 DIAGNOSIS — E876 Hypokalemia: Secondary | ICD-10-CM

## 2024-02-08 DIAGNOSIS — J986 Disorders of diaphragm: Secondary | ICD-10-CM

## 2024-02-08 DIAGNOSIS — C189 Malignant neoplasm of colon, unspecified: Secondary | ICD-10-CM | POA: Diagnosis not present

## 2024-02-08 NOTE — Assessment & Plan Note (Signed)
 Stable and doing very well at this time.

## 2024-02-08 NOTE — Patient Instructions (Addendum)
 Heel lock ankle tape  See you again in 2 months

## 2024-02-08 NOTE — Assessment & Plan Note (Signed)
 Continues to have difficulty.  Patient has been trying to do some self manipulation techniques.  Will need to consider if we can and unlock potentially the calcaneal talar aspect.  Discussed with patient about icing regimen and home exercises, increase activity slowly.  Showed patient a new weight to tape the ankle.  Follow-up again in 6 to 8 weeks otherwise.

## 2024-02-08 NOTE — Progress Notes (Signed)
 [facility] [address] [place] [address] Phone: [phone] Subjective:    I'm seeing this patient by the request  of:  Job Browning, PA-C  CC: Ankle pain  YEP:Dlagzrupcz  Bianca [name] is a 58 y.o. female coming in with complaint of ankle pain. Wants to know how to wrap her ankle. Has been having some bad diarrhea. Wants to talk to you about other things.       Past Medical History:  Diagnosis Date   Allergy to alpha-gal    Thyroid disease    Past Surgical History:  Procedure Laterality Date   ADENOIDECTOMY     TONSILLECTOMY     Social History   Socioeconomic History   Marital status: Single    Spouse name: Not on file   Number of children: Not on file   Years of education: Not on file   Highest education level: Not on file  Occupational History   Not on file  Tobacco Use   Smoking status: Never   Smokeless tobacco: Not on file  Substance and Sexual Activity   Alcohol use: No   Drug use: No   Sexual activity: Not on file  Other Topics Concern   Not on file  Social History Narrative   Not on file   Social Drivers of Health   Financial Resource Strain: High Risk (10/04/2023)   Received from [facility]   Overall Financial Resource Strain (CARDIA)    Difficulty of Paying Living Expenses: Hard  Food Insecurity: Food Insecurity Present (10/04/2023)   Received from [facility]   Hunger Vital Sign    Within the past 12 months, you worried that your food would run out before you got the money to buy more.: Sometimes true    Within the past 12 months, the food you bought just didn't last and you didn't have money to get more.: Sometimes true  Transportation Needs: No Transportation Needs (10/04/2023)   Received from [facility] - Transportation    Lack of Transportation (Medical): No    Lack of Transportation (Non-Medical): No  Physical Activity: Insufficiently Active (10/04/2023)   Received from  [facility]   Exercise Vital Sign    On average, how many days per week do you engage in moderate to strenuous exercise (like a brisk walk)?: 2 days    On average, how many minutes do you engage in exercise at this level?: 10 min  Stress: No Stress Concern Present (10/04/2023)   Received from [facility] - Occupational Stress Questionnaire    Feeling of Stress : Only a little  Recent Concern: Stress - Stress Concern Present (08/02/2023)   Received from [facility] of Occupational Health - Occupational Stress Questionnaire    Feeling of Stress : To some extent  Social Connections: Moderately Integrated (10/04/2023)   Received from [facility]   Social Network    How would you rate your social network (family, work, friends)?: Adequate participation with social networks   Allergies  Allergen Reactions   Atropine Sulfate Other (See Comments)    Low BP; numbness in both arms    Beef (Bovine) Protein Anaphylaxis    Alpha-gal syndrome    Beef-Derived Drug Products Anaphylaxis    Alpha-gal syndrome    Gelatin Anaphylaxis    Alpha-gal syndrome    Heparin Anaphylaxis    Porcine heparin - Alpha-gal syndrome  Heparin (Bovine) Anaphylaxis    Alpha-gal syndrome   Milk (Cow) Anaphylaxis    Alpha-gal syndrome    Milk-Related Compounds Anaphylaxis    Alpha-gal syndrome    Pork-Derived Products Anaphylaxis    Alpha-gal syndrome    Sulfa Antibiotics Anaphylaxis   Prochlorperazine     Lowered BP; fainted    Shellfish Allergy Hives    Had reaction once to shrimp   Family History  Problem Relation Age of Onset   Parkinson's disease Father     Current Outpatient Medications (Endocrine & Metabolic):    levothyroxine (SYNTHROID) 137 MCG tablet, Take 68.5 mcg by mouth daily.  Current Outpatient Medications (Cardiovascular):    EPINEPHrine 0.3 mg/0.3 mL IJ SOAJ injection, Inject 0.3 mg into the muscle once as  needed for anaphylaxis.  Current Outpatient Medications (Respiratory):    beclomethasone (QVAR  REDIHALER) 40 MCG/ACT inhaler, Inhale 1 puff into the lungs 2 (two) times daily.   cromolyn  (NASALCROM ) 5.2 MG/ACT nasal spray, Place 1 spray into both nostrils 4 (four) times daily.    Current Outpatient Medications (Other):    bismuth subsalicylate (PEPTO BISMOL) 262 MG/15ML suspension, Take 30 mLs by mouth daily as needed for indigestion or diarrhea or loose stools.   Reviewed prior external information including notes and imaging from  primary care provider As well as notes that were available from care everywhere and other healthcare systems.  Past medical history, social, surgical and family history all reviewed in electronic medical record.  No pertanent information unless stated regarding to the chief complaint.   Review of Systems:  No headache, visual changes, nausea, vomiting, constipation, dizziness, , skin rash, fevers, chills, night sweats, weight loss, swollen lymph nodes, body aches, joint swelling, chest pain, shortness of breath, mood changes. POSITIVE muscle aches, diarrhea, abdominal pain  Objective  Blood pressure 106/64, pulse 77, height 5' 7 (1.702 m), weight 157 lb (71.2 kg), SpO2 97%.   General: No apparent distress alert and oriented x3 mood and affect normal, dressed appropriately.  HEENT: Pupils equal, extraocular movements intact  Respiratory: Patient's speak in full sentences and does not appear short of breath  Cardiovascular: No lower extremity edema, non tender, no erythema  Ankle exam does have some mild limited range of motion.  Very mild swelling noted still noted of the ankle mortise.  Overall though nontender on exam.  Able to stand on it without any significant difficulty.    Impression and Recommendations:

## 2024-02-08 NOTE — Assessment & Plan Note (Signed)
 Most recent CT scan showed no recurrence in greater than 5 years.

## 2024-02-08 NOTE — Assessment & Plan Note (Signed)
 Patient has had some diarrhea recently.  Concern for electrolytes and discussed supplementation.  Patient is being worked up for multiple different infectious etiologies and is supposed to have a colonoscopy relatively soon

## 2024-02-09 ENCOUNTER — Ambulatory Visit: Admitting: Family Medicine

## 2024-03-06 NOTE — Progress Notes (Unsigned)
 [facility] [address] [place] [address] Phone: [phone] Subjective:   Bianca [name], am serving as a scribe for Dr. Arthea Claudene.  I'[name] seeing this patient by the request  of:  Job Browning, PA-C  CC: Ankle pain follow-up  YEP:Dlagzrupcz  02/08/2024 Patient has had some diarrhea recently.  Concern for electrolytes and discussed supplementation.  Patient is being worked up for multiple different infectious etiologies and is supposed to have a colonoscopy relatively soon     Most recent CT scan showed no recurrence in greater than 5 years.     Continues to have difficulty.  Patient has been trying to do some self manipulation techniques.  Will need to consider if we can and unlock potentially the calcaneal talar aspect.  Discussed with patient about icing regimen and home exercises, increase activity slowly.  Showed patient a new weight to tape the ankle.  Follow-up again in 6 to 8 weeks otherwise.     Updated 03/08/2024 Bianca [name] is a 58 y.o. female coming in with complaint of ankle pain and MSK. Patient states that she has been wrapping ankle and this has been helpful.   Neck and hips are tight. Would like adjustment.   Wants to know where her GI upset has been coming from.        Past Medical History:  Diagnosis Date   Allergy to alpha-gal    Thyroid disease    Past Surgical History:  Procedure Laterality Date   ADENOIDECTOMY     TONSILLECTOMY     Social History   Socioeconomic History   Marital status: Single    Spouse name: Not on file   Number of children: Not on file   Years of education: Not on file   Highest education level: Not on file  Occupational History   Not on file  Tobacco Use   Smoking status: Never   Smokeless tobacco: Not on file  Substance and Sexual Activity   Alcohol use: No   Drug use: No   Sexual activity: Not on file  Other Topics Concern   Not on file  Social History  Narrative   Not on file   Social Drivers of Health   Financial Resource Strain: High Risk (10/04/2023)   Received from [facility]   Overall Financial Resource Strain (CARDIA)    Difficulty of Paying Living Expenses: Hard  Food Insecurity: Food Insecurity Present (10/04/2023)   Received from [facility]   Hunger Vital Sign    Within the past 12 months, you worried that your food would run out before you got the money to buy more.: Sometimes true    Within the past 12 months, the food you bought just didn't last and you didn't have money to get more.: Sometimes true  Transportation Needs: No Transportation Needs (10/04/2023)   Received from [facility] - Transportation    Lack of Transportation (Medical): No    Lack of Transportation (Non-Medical): No  Physical Activity: Insufficiently Active (10/04/2023)   Received from [facility]   Exercise Vital Sign    On average, how many days per week do you engage in moderate to strenuous exercise (like a brisk walk)?: 2 days    On average, how many minutes do you engage in exercise at this level?: 10 min  Stress: No Stress Concern Present (10/04/2023)   Received from [facility] of Occupational Health - Occupational Stress Questionnaire  Feeling of Stress : Only a little  Recent Concern: Stress - Stress Concern Present (08/02/2023)   Received from [facility] of Occupational Health - Occupational Stress Questionnaire    Feeling of Stress : To some extent  Social Connections: Moderately Integrated (10/04/2023)   Received from [facility]   Social Network    How would you rate your social network (family, work, friends)?: Adequate participation with social networks   Allergies  Allergen Reactions   Atropine Sulfate Other (See Comments)    Low BP; numbness in both arms    Bovine (Beef) Protein Anaphylaxis    Alpha-gal syndrome    Bovine (Beef) Protein-Containing Drug Products  Anaphylaxis    Alpha-gal syndrome    Gelatin Anaphylaxis    Alpha-gal syndrome    Heparin Anaphylaxis    Porcine heparin - Alpha-gal syndrome     Heparin (Bovine) Anaphylaxis    Alpha-gal syndrome   Milk (Cow) Anaphylaxis    Alpha-gal syndrome    Milk-Related Compounds Anaphylaxis    Alpha-gal syndrome    Porcine (Pork) Protein-Containing Drug Products Anaphylaxis    Alpha-gal syndrome    Sulfa Antibiotics Anaphylaxis   Prochlorperazine     Lowered BP; fainted    Shellfish Allergy Hives    Had reaction once to shrimp   Family History  Problem Relation Age of Onset   Parkinson's disease Father     Current Outpatient Medications (Endocrine & Metabolic):    levothyroxine (SYNTHROID) 137 MCG tablet, Take 68.5 mcg by mouth daily.  Current Outpatient Medications (Cardiovascular):    EPINEPHrine 0.3 mg/0.3 mL IJ SOAJ injection, Inject 0.3 mg into the muscle once as needed for anaphylaxis.  Current Outpatient Medications (Respiratory):    beclomethasone (QVAR  REDIHALER) 40 MCG/ACT inhaler, Inhale 1 puff into the lungs 2 (two) times daily.   cromolyn  (NASALCROM ) 5.2 MG/ACT nasal spray, Place 1 spray into both nostrils 4 (four) times daily.    Current Outpatient Medications (Other):    bismuth subsalicylate (PEPTO BISMOL) 262 MG/15ML suspension, Take 30 mLs by mouth daily as needed for indigestion or diarrhea or loose stools.   Reviewed prior external information including notes and imaging from  primary care provider As well as notes that were available from care everywhere and other healthcare systems.  Past medical history, social, surgical and family history all reviewed in electronic medical record.  No pertanent information unless stated regarding to the chief complaint.   Review of Systems:  No headache, visual changes, nausea, vomiting, diarrhea, constipation, dizziness, abdominal pain, skin rash, fevers, chills, night sweats, weight loss, swollen lymph nodes,  body aches, joint swelling, chest pain, shortness of breath, mood changes. POSITIVE muscle aches  Objective  There were no vitals taken for this visit.   General: No apparent distress alert and oriented x3 mood and affect normal, dressed appropriately.  HEENT: Pupils equal, extraocular movements intact  Respiratory: Patient's speak in full sentences and does not appear short of breath  Cardiovascular: No lower extremity edema, non tender, no erythema      Impression and Recommendations:    The above documentation has been reviewed and is accurate and complete Bianca [name] Bianca Pitter, DO

## 2024-03-08 ENCOUNTER — Ambulatory Visit: Admitting: Family Medicine

## 2024-03-08 VITALS — BP 104/62 | HR 71 | Ht 67.0 in | Wt 155.0 lb

## 2024-03-08 DIAGNOSIS — M545 Low back pain, unspecified: Secondary | ICD-10-CM | POA: Diagnosis not present

## 2024-03-08 DIAGNOSIS — M9903 Segmental and somatic dysfunction of lumbar region: Secondary | ICD-10-CM | POA: Diagnosis not present

## 2024-03-08 DIAGNOSIS — M9904 Segmental and somatic dysfunction of sacral region: Secondary | ICD-10-CM

## 2024-03-08 DIAGNOSIS — M9902 Segmental and somatic dysfunction of thoracic region: Secondary | ICD-10-CM

## 2024-03-08 DIAGNOSIS — M9908 Segmental and somatic dysfunction of rib cage: Secondary | ICD-10-CM

## 2024-03-08 DIAGNOSIS — M9901 Segmental and somatic dysfunction of cervical region: Secondary | ICD-10-CM | POA: Diagnosis not present

## 2024-03-08 DIAGNOSIS — G8929 Other chronic pain: Secondary | ICD-10-CM | POA: Diagnosis not present

## 2024-03-08 NOTE — Patient Instructions (Signed)
 Chromelin 2x a day Side bends See me in 2-3 months

## 2024-03-09 ENCOUNTER — Encounter: Payer: Self-pay | Admitting: Family Medicine

## 2024-03-09 NOTE — Assessment & Plan Note (Signed)
 Seems to be doing relatively well overall.  No significant other difficulties when it comes to the musculoskeletal component.  Patient still though is having some GI discomfort which I think is potentially continuing to have some discomfort as well.  Increase activity as tolerated.  Discussed icing regimen.  Follow-up again in 6 to 12 weeks

## 2024-03-22 ENCOUNTER — Ambulatory Visit: Admitting: Family Medicine

## 2024-04-06 NOTE — Progress Notes (Signed)
 [name] [address] [place] [address] Phone: [phone] Subjective:   LILLETTE Claretha Schimke am a scribe for Dr. Claudene.   I'm seeing this patient by the request  of:  Job Browning, PA-C  CC: Back and neck pain follow-up  YEP:Dlagzrupcz  Brylie Sneath is a 58 y.o. female coming in with complaint of back and neck pain. OMT on 03/08/2024. Patient states that it is a little out of place. The right hand went numb on the way over here. Think she sprained the left foot about three weeks ago.   Medications patient has been prescribed:   Taking:         Reviewed prior external information including notes and imaging from previsou exam, outside providers and external EMR if available.   As well as notes that were available from care everywhere and other healthcare systems.  Past medical history, social, surgical and family history all reviewed in electronic medical record.  No pertanent information unless stated regarding to the chief complaint.   Past Medical History:  Diagnosis Date   Allergy to alpha-gal    Thyroid disease     Allergies  Allergen Reactions   Atropine Sulfate Other (See Comments)    Low BP; numbness in both arms    Bovine (Beef) Protein Anaphylaxis    Alpha-gal syndrome    Bovine (Beef) Protein-Containing Drug Products Anaphylaxis    Alpha-gal syndrome    Gelatin Anaphylaxis    Alpha-gal syndrome    Heparin Anaphylaxis    Porcine heparin - Alpha-gal syndrome     Heparin (Bovine) Anaphylaxis    Alpha-gal syndrome   Milk (Cow) Anaphylaxis    Alpha-gal syndrome    Milk-Related Compounds Anaphylaxis    Alpha-gal syndrome    Porcine (Pork) Protein-Containing Drug Products Anaphylaxis    Alpha-gal syndrome    Sulfa Antibiotics Anaphylaxis   Prochlorperazine     Lowered BP; fainted    Shellfish Allergy Hives    Had reaction once to shrimp     Review of Systems:  No headache, visual changes,  nausea, vomiting, diarrhea, constipation, dizziness, abdominal pain, skin rash, fevers, chills, night sweats, weight loss, swollen lymph nodes, body aches, joint swelling, chest pain, shortness of breath, mood changes. POSITIVE muscle aches  Objective  Blood pressure 100/60, pulse 76, height 5' 7 (1.702 m), weight 154 lb (69.9 kg), SpO2 96%.   General: No apparent distress alert and oriented x3 mood and affect normal, dressed appropriately.  HEENT: Pupils equal, extraocular movements intact  Respiratory: Patient's speak in full sentences and does not appear short of breath  Cardiovascular: No lower extremity edema, non tender, no erythema  Gait relatively normal MSK:  Back does have some loss of lordosis.  Some tenderness to palpation in the paraspinal musculature.  Tightness noted in the parascapular area right greater than left.  Osteopathic findings  C6 flexed rotated and side bent left T3 extended rotated and side bent right inhaled rib T6 extended rotated and side bent left with inhaled rib T9 extended rotated and side bent right inhaled T11 extended rotated and side bent left L2 flexed rotated and side bent right Sacrum r left on left     Assessment and Plan:  Back pain Upper back pain, discussed icing regimen and home exercises, discussed which activities to do and which ones to avoid.  Increase activity slowly.  Discussed icing regimen.  Follow-up again in 6 to 12 weeks.  Right ankle pain Intermittent pain  still, the monitor.  Again in 6 to 12 weeks    Nonallopathic problems  Decision today to treat with OMT was based on Physical Exam  After verbal consent patient was treated with HVLA, ME, FPR techniques in cervical, rib, thoracic, lumbar, and sacral  areas  Patient tolerated the procedure well with improvement in symptoms  Patient given exercises, stretches and lifestyle modifications  See medications in patient instructions if given  Patient will follow up in  4-8 weeks     The above documentation has been reviewed and is accurate and complete Zaine Elsass M Luzmaria Devaux, DO         Note: This dictation was prepared with Dragon dictation along with smaller phrase technology. Any transcriptional errors that result from this process are unintentional.

## 2024-04-11 ENCOUNTER — Ambulatory Visit (INDEPENDENT_AMBULATORY_CARE_PROVIDER_SITE_OTHER): Admitting: Family Medicine

## 2024-04-11 ENCOUNTER — Encounter: Payer: Self-pay | Admitting: Family Medicine

## 2024-04-11 VITALS — BP 100/60 | HR 76 | Ht 67.0 in | Wt 154.0 lb

## 2024-04-11 DIAGNOSIS — M9908 Segmental and somatic dysfunction of rib cage: Secondary | ICD-10-CM

## 2024-04-11 DIAGNOSIS — M9903 Segmental and somatic dysfunction of lumbar region: Secondary | ICD-10-CM | POA: Diagnosis not present

## 2024-04-11 DIAGNOSIS — M25571 Pain in right ankle and joints of right foot: Secondary | ICD-10-CM

## 2024-04-11 DIAGNOSIS — M9901 Segmental and somatic dysfunction of cervical region: Secondary | ICD-10-CM | POA: Diagnosis not present

## 2024-04-11 DIAGNOSIS — G8929 Other chronic pain: Secondary | ICD-10-CM | POA: Diagnosis not present

## 2024-04-11 DIAGNOSIS — M545 Low back pain, unspecified: Secondary | ICD-10-CM | POA: Diagnosis not present

## 2024-04-11 DIAGNOSIS — M9904 Segmental and somatic dysfunction of sacral region: Secondary | ICD-10-CM

## 2024-04-11 DIAGNOSIS — M9902 Segmental and somatic dysfunction of thoracic region: Secondary | ICD-10-CM | POA: Diagnosis not present

## 2024-04-11 NOTE — Assessment & Plan Note (Signed)
 Upper back pain, discussed icing regimen and home exercises, discussed which activities to do and which ones to avoid.  Increase activity slowly.  Discussed icing regimen.  Follow-up again in 6 to 12 weeks.

## 2024-04-11 NOTE — Assessment & Plan Note (Signed)
 Intermittent pain still, the monitor.  Again in 6 to 12 weeks

## 2024-04-11 NOTE — Patient Instructions (Signed)
Good to see you   See me again as scheduled

## 2024-04-19 ENCOUNTER — Ambulatory Visit: Admitting: Family Medicine

## 2024-05-05 NOTE — Progress Notes (Unsigned)
 " [name] [address] [place] [address] Phone: [phone] Subjective:    I'm seeing this patient by the request  of:  Job Browning, PA-C  CC:   YEP:Dlagzrupcz  Cherylene Ferrufino is a 58 y.o. female coming in with complaint of back and neck pain. OMT on 04/11/2024. Patient states   Medications patient has been prescribed:   Taking:         Reviewed prior external information including notes and imaging from previsou exam, outside providers and external EMR if available.   As well as notes that were available from care everywhere and other healthcare systems.  Past medical history, social, surgical and family history all reviewed in electronic medical record.  No pertanent information unless stated regarding to the chief complaint.   Past Medical History:  Diagnosis Date   Allergy to alpha-gal    Thyroid disease     Allergies[1]   Review of Systems:  No headache, visual changes, nausea, vomiting, diarrhea, constipation, dizziness, abdominal pain, skin rash, fevers, chills, night sweats, weight loss, swollen lymph nodes, body aches, joint swelling, chest pain, shortness of breath, mood changes. POSITIVE muscle aches  Objective  There were no vitals taken for this visit.   General: No apparent distress alert and oriented x3 mood and affect normal, dressed appropriately.  HEENT: Pupils equal, extraocular movements intact  Respiratory: Patient's speak in full sentences and does not appear short of breath  Cardiovascular: No lower extremity edema, non tender, no erythema  Gait MSK:  Back   Osteopathic findings  C2 flexed rotated and side bent right C6 flexed rotated and side bent left T3 extended rotated and side bent right inhaled rib T9 extended rotated and side bent left L2 flexed rotated and side bent right Sacrum right on right       Assessment and Plan:  No problem-specific Assessment & Plan notes found for  this encounter.    Nonallopathic problems  Decision today to treat with OMT was based on Physical Exam  After verbal consent patient was treated with HVLA, ME, FPR techniques in cervical, rib, thoracic, lumbar, and sacral  areas  Patient tolerated the procedure well with improvement in symptoms  Patient given exercises, stretches and lifestyle modifications  See medications in patient instructions if given  Patient will follow up in 4-8 weeks             Note: This dictation was prepared with Dragon dictation along with smaller phrase technology. Any transcriptional errors that result from this process are unintentional.            [1]  Allergies Allergen Reactions   Atropine Sulfate Other (See Comments)    Low BP; numbness in both arms    Bovine (Beef) Protein Anaphylaxis    Alpha-gal syndrome    Bovine (Beef) Protein-Containing Drug Products Anaphylaxis    Alpha-gal syndrome    Gelatin Anaphylaxis    Alpha-gal syndrome    Heparin Anaphylaxis    Porcine heparin - Alpha-gal syndrome     Heparin (Bovine) Anaphylaxis    Alpha-gal syndrome   Milk (Cow) Anaphylaxis    Alpha-gal syndrome    Milk-Related Compounds Anaphylaxis    Alpha-gal syndrome    Porcine (Pork) Protein-Containing Drug Products Anaphylaxis    Alpha-gal syndrome    Sulfa Antibiotics Anaphylaxis   Prochlorperazine     Lowered BP; fainted    Shellfish Allergy Hives    Had reaction once to shrimp   "

## 2024-05-09 ENCOUNTER — Encounter: Payer: Self-pay | Admitting: Family Medicine

## 2024-05-09 ENCOUNTER — Ambulatory Visit: Admitting: Family Medicine

## 2024-05-09 VITALS — BP 104/58 | HR 80 | Ht 67.0 in | Wt 154.0 lb

## 2024-05-09 DIAGNOSIS — C189 Malignant neoplasm of colon, unspecified: Secondary | ICD-10-CM

## 2024-05-09 DIAGNOSIS — M9904 Segmental and somatic dysfunction of sacral region: Secondary | ICD-10-CM

## 2024-05-09 DIAGNOSIS — C787 Secondary malignant neoplasm of liver and intrahepatic bile duct: Secondary | ICD-10-CM

## 2024-05-09 DIAGNOSIS — M9908 Segmental and somatic dysfunction of rib cage: Secondary | ICD-10-CM

## 2024-05-09 DIAGNOSIS — M9902 Segmental and somatic dysfunction of thoracic region: Secondary | ICD-10-CM | POA: Diagnosis not present

## 2024-05-09 DIAGNOSIS — E538 Deficiency of other specified B group vitamins: Secondary | ICD-10-CM

## 2024-05-09 DIAGNOSIS — M9903 Segmental and somatic dysfunction of lumbar region: Secondary | ICD-10-CM

## 2024-05-09 DIAGNOSIS — E559 Vitamin D deficiency, unspecified: Secondary | ICD-10-CM | POA: Diagnosis not present

## 2024-05-09 DIAGNOSIS — M9901 Segmental and somatic dysfunction of cervical region: Secondary | ICD-10-CM

## 2024-05-09 DIAGNOSIS — M545 Low back pain, unspecified: Secondary | ICD-10-CM | POA: Diagnosis not present

## 2024-05-09 DIAGNOSIS — G8929 Other chronic pain: Secondary | ICD-10-CM

## 2024-05-09 NOTE — Assessment & Plan Note (Signed)
 Patient has been doing wonderful and will continue with her specialist.

## 2024-05-09 NOTE — Assessment & Plan Note (Signed)
 Continue supplementation  ?

## 2024-05-09 NOTE — Assessment & Plan Note (Signed)
 Still has done relatively good.  I think patient's pain is more secondary to soreness from being a little more active.  Has been able to do swimming which is something she did not think she would be able to do again.  Discussed icing regimen and home exercises.  Increase activity slowly.  Follow-up again in 6 to 12 weeks

## 2024-05-09 NOTE — Patient Instructions (Signed)
 Good to see you! Happy Holidays Follow up with Cardiology Find more pools See you again in 2 months

## 2024-05-31 ENCOUNTER — Ambulatory Visit: Admitting: Family Medicine

## 2024-06-06 NOTE — Progress Notes (Unsigned)
 " [name] [address] [place] [address] Phone: [phone] Subjective:   Bianca [name] Bianca [name], am serving as a scribe for Dr. Arthea Claudene.  I'm seeing this patient by the request  of:  Job Browning, PA-C  CC: Back and neck pain follow-up  YEP:Dlagzrupcz  Bianca [name] is a 59 y.o. female coming in with complaint of back and neck pain. OMT on 05/09/2024. Patient states that her neck is tight and sore. Having migraines for past week intermittently. Has not had these symptoms for years. Does have sinus pressure and balance seems to be off. Performing lumbar flexion seems to increases these symptoms. Feels like vision is off.   R ankle popped out of place and needs an adjustment.   Medications patient has been prescribed:   Taking:  Patient has been seen by cardiology and gastroenterology recently.  Was continuing to have palpitations and is considering the possibility of a Holter monitor.   Gastroenterology was consideringcolestipol/Xifaxan     Reviewed prior external information including notes and imaging from previsou exam, outside providers and external EMR if available.   As well as notes that were available from care everywhere and other healthcare systems.  Past medical history, social, surgical and family history all reviewed in electronic medical record.  No pertanent information unless stated regarding to the chief complaint.   Past Medical History:  Diagnosis Date   Allergy to alpha-gal    Thyroid disease     Allergies[1]   Review of Systems:  No  visual changes, nausea, vomiting, diarrhea, constipation,  abdominal pain, skin rash, fevers, chills, night sweats, weight loss, swollen lymph nodes, body aches, joint swelling, chest pain, shortness of breath, mood changes. POSITIVE muscle aches, Headache, dizziness  Objective  Blood pressure 122/64, pulse 66, height 5' 7 (1.702 m), weight 153 lb (69.4 kg), SpO2 98%.    General: No apparent distress alert and oriented x3 mood and affect normal, dressed appropriately.  HEENT: Pupils equal, extraocular movements intact  Respiratory: Patient's speak in full sentences and does not appear short of breath  Cardiovascular: No lower extremity edema, non tender, no erythema  More tightness noted in the neck, occipital region,  Osteopathic findings  C2 flexed rotated and side bent right C3 flexed rotated and side bent right C7 flexed rotated and side bent right T3 extended rotated and side bent right inhaled rib L2 flexed rotated and side bent right Sacrum right on right       Assessment and Plan:  Back pain Multifactorial to avoid.  Increase activity slowly.  Patient will continue work on core strengthening.  Follow-up again in 6 to 8 weeks  Low ferritin Has been more tired, would recommend primary care recheck  Vitamin B12 deficiency Patient is seeing primary care next week and will have this rechecked    Nonallopathic problems  Decision today to treat with OMT was based on Physical Exam  After verbal consent patient was treated with HVLA, ME, FPR techniques in cervical, rib, thoracic, lumbar, and sacral  areas  Patient tolerated the procedure well with improvement in symptoms  Patient given exercises, stretches and lifestyle modifications  See medications in patient instructions if given  Patient will follow up in 4-8 weeks     The above documentation has been reviewed and is accurate and complete Bianca Kramme M [name], DO         Note: This dictation was prepared with Dragon dictation along with smaller phrase technology. Any  transcriptional errors that result from this process are unintentional.            [1]  Allergies Allergen Reactions   Atropine Sulfate Other (See Comments)    Low BP; numbness in both arms    Bovine (Beef) Protein Anaphylaxis    Alpha-gal syndrome    Bovine (Beef) Protein-Containing Drug Products  Anaphylaxis    Alpha-gal syndrome    Gelatin Anaphylaxis    Alpha-gal syndrome    Heparin Anaphylaxis    Porcine heparin - Alpha-gal syndrome     Heparin (Bovine) Anaphylaxis    Alpha-gal syndrome   Milk (Cow) Anaphylaxis    Alpha-gal syndrome    Milk-Related Compounds Anaphylaxis    Alpha-gal syndrome    Porcine (Pork) Protein-Containing Drug Products Anaphylaxis    Alpha-gal syndrome    Sulfa Antibiotics Anaphylaxis   Prochlorperazine     Lowered BP; fainted    Shellfish Allergy Hives    Had reaction once to shrimp   "

## 2024-06-07 ENCOUNTER — Encounter: Payer: Self-pay | Admitting: Family Medicine

## 2024-06-07 ENCOUNTER — Ambulatory Visit: Admitting: Family Medicine

## 2024-06-07 VITALS — BP 122/64 | HR 66 | Ht 67.0 in | Wt 153.0 lb

## 2024-06-07 DIAGNOSIS — M9908 Segmental and somatic dysfunction of rib cage: Secondary | ICD-10-CM | POA: Diagnosis not present

## 2024-06-07 DIAGNOSIS — R790 Abnormal level of blood mineral: Secondary | ICD-10-CM

## 2024-06-07 DIAGNOSIS — E538 Deficiency of other specified B group vitamins: Secondary | ICD-10-CM

## 2024-06-07 DIAGNOSIS — M9901 Segmental and somatic dysfunction of cervical region: Secondary | ICD-10-CM

## 2024-06-07 DIAGNOSIS — M9902 Segmental and somatic dysfunction of thoracic region: Secondary | ICD-10-CM | POA: Diagnosis not present

## 2024-06-07 DIAGNOSIS — M9904 Segmental and somatic dysfunction of sacral region: Secondary | ICD-10-CM | POA: Diagnosis not present

## 2024-06-07 DIAGNOSIS — G8929 Other chronic pain: Secondary | ICD-10-CM | POA: Diagnosis not present

## 2024-06-07 DIAGNOSIS — M545 Low back pain, unspecified: Secondary | ICD-10-CM

## 2024-06-07 DIAGNOSIS — M9903 Segmental and somatic dysfunction of lumbar region: Secondary | ICD-10-CM

## 2024-06-07 NOTE — Assessment & Plan Note (Signed)
 Has been more tired, would recommend primary care recheck

## 2024-06-07 NOTE — Assessment & Plan Note (Signed)
 Patient is seeing primary care next week and will have this rechecked

## 2024-06-07 NOTE — Patient Instructions (Signed)
 Good to see you Remember pressure points on your face Primary Care at [facility] Have primary check B12 See me in 2 months

## 2024-06-07 NOTE — Assessment & Plan Note (Signed)
 Multifactorial to avoid.  Increase activity slowly.  Patient will continue work on core strengthening.  Follow-up again in 6 to 8 weeks

## 2024-07-06 ENCOUNTER — Ambulatory Visit: Admitting: Family Medicine

## 2024-08-07 ENCOUNTER — Ambulatory Visit: Admitting: Family Medicine

## 2024-09-05 ENCOUNTER — Ambulatory Visit: Admitting: Family Medicine
